# Patient Record
Sex: Female | Born: 1935 | Race: White | Hispanic: No | Smoking: Never smoker
Health system: Southern US, Community
[De-identification: ages and names within clinical notes are randomized; demographics above are authoritative.]

## PROBLEM LIST (undated history)

## (undated) DIAGNOSIS — I639 Cerebral infarction, unspecified: Secondary | ICD-10-CM

## (undated) DIAGNOSIS — K219 Gastro-esophageal reflux disease without esophagitis: Secondary | ICD-10-CM

## (undated) DIAGNOSIS — IMO0002 Reserved for concepts with insufficient information to code with codable children: Secondary | ICD-10-CM

## (undated) DIAGNOSIS — I519 Heart disease, unspecified: Secondary | ICD-10-CM

## (undated) DIAGNOSIS — I10 Essential (primary) hypertension: Secondary | ICD-10-CM

## (undated) HISTORY — PX: CARDIAC SURGERY: SHX584

## (undated) HISTORY — DX: Heart disease, unspecified: I51.9

## (undated) HISTORY — PX: ABDOMINAL HYSTERECTOMY: SHX81

## (undated) HISTORY — PX: TUBAL LIGATION: SHX77

## (undated) HISTORY — DX: Gastro-esophageal reflux disease without esophagitis: K21.9

## (undated) HISTORY — DX: Cerebral infarction, unspecified: I63.9

## (undated) HISTORY — PX: EYE SURGERY: SHX253

## (undated) HISTORY — DX: Essential (primary) hypertension: I10

## (undated) HISTORY — DX: Reserved for concepts with insufficient information to code with codable children: IMO0002

## (undated) HISTORY — PX: APPENDECTOMY: SHX54

---

## 2008-09-25 ENCOUNTER — Ambulatory Visit: Payer: Self-pay | Admitting: Unknown Physician Specialty

## 2008-09-26 ENCOUNTER — Ambulatory Visit: Payer: Self-pay | Admitting: Unknown Physician Specialty

## 2008-10-10 ENCOUNTER — Ambulatory Visit: Payer: Self-pay | Admitting: Gynecologic Oncology

## 2008-10-15 ENCOUNTER — Ambulatory Visit: Payer: Self-pay | Admitting: Gynecologic Oncology

## 2008-11-05 ENCOUNTER — Ambulatory Visit: Payer: Self-pay | Admitting: Gynecologic Oncology

## 2008-11-10 ENCOUNTER — Ambulatory Visit: Payer: Self-pay | Admitting: Gynecologic Oncology

## 2008-11-12 ENCOUNTER — Inpatient Hospital Stay: Payer: Self-pay | Admitting: Unknown Physician Specialty

## 2008-11-25 ENCOUNTER — Ambulatory Visit: Payer: Self-pay | Admitting: Gynecologic Oncology

## 2008-12-11 ENCOUNTER — Ambulatory Visit: Payer: Self-pay | Admitting: Gynecologic Oncology

## 2009-07-29 ENCOUNTER — Inpatient Hospital Stay: Payer: Self-pay | Admitting: Cardiology

## 2009-08-21 ENCOUNTER — Ambulatory Visit: Payer: Self-pay | Admitting: Internal Medicine

## 2010-12-25 ENCOUNTER — Ambulatory Visit: Payer: Self-pay | Admitting: Internal Medicine

## 2014-12-17 ENCOUNTER — Other Ambulatory Visit
Admission: RE | Admit: 2014-12-17 | Discharge: 2014-12-17 | Disposition: A | Discharge status: Home / Self Care | Payer: Medicare Other | Source: Other Acute Inpatient Hospital | Attending: Cardiology | Admitting: Cardiology

## 2014-12-17 DIAGNOSIS — R079 Chest pain, unspecified: Secondary | ICD-10-CM | POA: Diagnosis present

## 2014-12-17 LAB — TROPONIN I: Troponin I: <0.03 ng/mL (ref <0.031)

## 2017-09-16 ENCOUNTER — Ambulatory Visit (INDEPENDENT_AMBULATORY_CARE_PROVIDER_SITE_OTHER): Payer: Medicare Other | Admitting: Obstetrics and Gynecology

## 2017-09-16 ENCOUNTER — Encounter: Payer: Self-pay | Admitting: Obstetrics and Gynecology

## 2017-09-16 VITALS — BP 148/78 | HR 82 | Ht 65.0 in | Wt 196.0 lb

## 2017-09-16 DIAGNOSIS — R300 Dysuria: Secondary | ICD-10-CM

## 2017-09-16 DIAGNOSIS — L900 Lichen sclerosus et atrophicus: Secondary | ICD-10-CM

## 2017-09-16 DIAGNOSIS — R350 Frequency of micturition: Secondary | ICD-10-CM

## 2017-09-16 LAB — POCT URINALYSIS DIPSTICK
Bilirubin, UA: NEGATIVE
Blood, UA: NEGATIVE
GLUCOSE UA: NEGATIVE
KETONES UA: NEGATIVE
Leukocytes, UA: NEGATIVE
NITRITE UA: NEGATIVE
PROTEIN UA: NEGATIVE
SPEC GRAV UA: 1.015 (ref 1.010–1.025)
Urobilinogen, UA: 0.2 E.U./dL
pH, UA: 6 (ref 5.0–8.0)

## 2017-09-16 MED ORDER — CLOBETASOL PROPIONATE 0.05 % EX OINT: TOPICAL_OINTMENT | CUTANEOUS | 5 refills | Status: AC

## 2017-09-16 NOTE — Progress Notes (Signed)
Patient ID: Jaime Stevenson, female   DOB: May 10, 1935, 82 y.o.   MRN: [id number]  Reason for Consult: Post menopausal bleeding (single episode PMB)   Referred by No ref. provider found  Subjective:     HPI:  Jaime MornWilma P Wease is a 82 y.o. female On Tuesday Jaime Stevenson had bright red vaginal bleeding when she wiped. She has not noticed any blood in her bowel movements or urine.  Medical records were reviewed and patien had a TAH, BSO, pelvic lymph node dissection for endometrial cancer in 2010, Stage IA, grade I. No myometrial invasion.   Past Medical History:  Diagnosis Date  . Heart disease   . Hypertension    Family History  Problem Relation Age of Onset  . Ovarian cancer Mother [id number]   Past Surgical History:  Procedure Laterality Date  . ABDOMINAL HYSTERECTOMY    . CARDIAC SURGERY     stent placement    Short Social History:  Social History   Tobacco Use  . Smoking status: Never Smoker  . Smokeless tobacco: Never Used  Substance Use Topics  . Alcohol use: Never    Frequency: Never    Allergies  Allergen Reactions  . Morphine     Other reaction(s): Hallucination    Current Outpatient Medications  Medication Sig Dispense Refill  . chlorthalidone (HYGROTON) 25 MG tablet   3  . famotidine (PEPCID) 20 MG tablet Take 20 mg by mouth 2 (two) times daily.    . metoprolol tartrate (LOPRESSOR) 25 MG tablet     . warfarin (COUMADIN) 2 MG tablet      No current facility-administered medications for this visit.     Review of Systems  Constitutional: Negative for chills, fatigue, fever and unexpected weight change.  HENT: Negative for trouble swallowing.  Eyes: Negative for loss of vision.  Respiratory: Negative for cough, shortness of breath and wheezing.  Cardiovascular: Negative for chest pain, leg swelling, palpitations and syncope.  GI: Negative for abdominal pain, blood in stool, diarrhea, nausea and vomiting.  GU: Negative for difficulty urinating, dysuria, frequency and  hematuria.  Musculoskeletal: Negative for back pain, leg pain and joint pain.  Skin: Negative for rash.  Neurological: Negative for dizziness, headaches, light-headedness, numbness and seizures.  Psychiatric: Negative for behavioral problem, confusion, depressed mood and sleep disturbance.        Objective:  Objective   Vitals:   09/16/17 1422  BP: (!) 148/78  Pulse: 82  Weight: 196 lb (88.9 kg)  Height: 5\' 5"  (1.651 m)   Body mass index is 32.62 kg/m.  Physical Exam Vitals and nursing note reviewed. Exam conducted with a chaperone present.  Constitutional:      Appearance: Normal appearance. She is well-developed.  HENT:     Head: Normocephalic and atraumatic.  Eyes:     Extraocular Movements: Extraocular movements intact.     Pupils: Pupils are equal, round, and reactive to light.  Cardiovascular:     Rate and Rhythm: Normal rate and regular rhythm.  Pulmonary:     Effort: Pulmonary effort is normal. No respiratory distress.     Breath sounds: Normal breath sounds.  Abdominal:     General: Abdomen is flat.     Palpations: Abdomen is soft.  Genitourinary:    Comments: External: Appearance consistent with lichen sclerosis. Speculum examination: Normal appearing cuff. No blood in the vaginal vault. Bimanual exam: No adnexal masses. No adnexal tenderness. Pelvis not fixed.  Musculoskeletal:  General: No signs of injury.  Skin:    General: Skin is warm and dry.  Neurological:     Mental Status: She is alert and oriented to person, place, and time.  Psychiatric:        Behavior: Behavior normal.        Thought Content: Thought content normal.        Judgment: Judgment normal.        Assessment/Plan:    82 yo  With vaginal bleeding, hx of endometial cancer.  CT ordered.  Clobetasol for lichen Sclerosis  Adelene Idler MD [facility], [facility] 09/16/17 2:57 PM

## 2017-10-07 ENCOUNTER — Encounter: Payer: Self-pay | Admitting: Obstetrics and Gynecology

## 2017-10-07 ENCOUNTER — Ambulatory Visit (INDEPENDENT_AMBULATORY_CARE_PROVIDER_SITE_OTHER): Payer: Medicare Other | Admitting: Obstetrics and Gynecology

## 2017-10-07 VITALS — BP 140/86 | HR 79 | Ht 65.0 in | Wt 201.0 lb

## 2017-10-07 DIAGNOSIS — Z08 Encounter for follow-up examination after completed treatment for malignant neoplasm: Secondary | ICD-10-CM

## 2017-10-07 DIAGNOSIS — Z8542 Personal history of malignant neoplasm of other parts of uterus: Secondary | ICD-10-CM

## 2017-10-07 NOTE — Progress Notes (Signed)
Patient ID: Jaime Stevenson, female   DOB: [birth year], 82 y.o.   MRN: [id number]  Reason for Consult: Follow-up (Vaginal itching. Frequent urination )   Referred by [facility]  Subjective:     HPI:  Jaime MornWilma P Boucher is a 82 y.o. female  She did not get ointment because it was too expensive out of pocket. Her lichen simplex has not been bothering her with itching. She has not had any more bleeding.   I discussed with Marylouise StacksWilma today how the pathology from her hysterectomy had showed that she had stage I endometrial cancer.  She had told me but previously that she did not recall having endometrial cancer but reports today that when she went home and thought about it she did remember being told that she had cancer.  She never followed up for her endometrial cancer.  I recommended that since she had had vaginal bleeding she should have a CT to look for any signs of recurrence.  She told me she does not want to have CT. I asked her why she did not want it to have the CT and she said that she did not want to know if she has endometrial cancer because if she does have a cancer recurrance she does not want to do anything about it. Her husband has had cancer treatment for 19 years and she does not think she wants to deal with what he has been through. He very often has to have CTs done and she is nervous about being in a confined space.  She says that she will consider the CT and let me know if she does want to have it.   Past Medical History:  Diagnosis Date  . Heart disease   . Hypertension    Family History  Problem Relation Age of Onset  . Ovarian cancer Mother 281   Past Surgical History:  Procedure Laterality Date  . ABDOMINAL HYSTERECTOMY    . CARDIAC SURGERY     stent placement    Short Social History:  Social History   Tobacco Use  . Smoking status: Never Smoker  . Smokeless tobacco: Never Used  Substance Use Topics  . Alcohol use: Never    Frequency: Never    Allergies   Allergen Reactions  . Morphine     Other reaction(s): Hallucination    Current Outpatient Medications  Medication Sig Dispense Refill  . chlorthalidone (HYGROTON) 25 MG tablet   3  . famotidine (PEPCID) 20 MG tablet Take 20 mg by mouth 2 (two) times daily.    . metoprolol tartrate (LOPRESSOR) 25 MG tablet     . warfarin (COUMADIN) 2 MG tablet     . clobetasol ointment (TEMOVATE) 0.05 % Apply to affected area every night for 4 weeks, then every other day for 4 weeks and then twice a week for 4 weeks or until resolution. (Patient not taking: Reported on 10/07/2017) 30 g 5   No current facility-administered medications for this visit.     Review of Systems  Constitutional: Negative for chills, fatigue, fever and unexpected weight change.  HENT: Negative for trouble swallowing.  Eyes: Negative for loss of vision.  Respiratory: Negative for cough, shortness of breath and wheezing.  Cardiovascular: Negative for chest pain, leg swelling, palpitations and syncope.  GI: Negative for abdominal pain, blood in stool, diarrhea, nausea and vomiting.  GU: Negative for difficulty urinating, dysuria, frequency and hematuria.  Musculoskeletal: Negative for back pain, leg pain and joint pain.  Skin: Negative for rash.  Neurological: Negative for dizziness, headaches, light-headedness, numbness and seizures.  Psychiatric: Negative for behavioral problem, confusion, depressed mood and sleep disturbance.        Objective:  Objective   Vitals:   10/07/17 0915  BP: 140/86  Pulse: 79  Weight: 201 lb (91.2 kg)  Height: 5\' 5"  (1.651 m)   Body mass index is 33.45 kg/m.  Physical Exam  Constitutional: She is oriented to person, place, and time. She appears well-developed and well-nourished.  HENT:  Head: Normocephalic and atraumatic.  Eyes: EOM are normal.  Cardiovascular: Normal rate, regular rhythm and normal heart sounds.  Pulmonary/Chest: Effort normal and breath sounds normal.   Genitourinary:  Genitourinary Comments: Lichen sclerosis present with complete loss of pigment in   Neurological: She is alert and oriented to person, place, and time.  Skin: Skin is warm and dry.  Psychiatric: She has a normal mood and affect. Her behavior is normal. Judgment and thought content normal.  Nursing note and vitals reviewed.      Assessment/Plan:    82 yo She did not get ointment because it was too expensive out of pocket. Her lichen simplex has not been bothering her with itching. She has not had any more bleeding.  She says today that she does not want to know if she has endometrial cancer because id she does have it she does not want to do anything about it. Her husband has had cancer treatment for 19 years and she does not think she wants to deal with what he has been through.  She will follow up as desired  Adelene Idler MD, [facility], [facility] 08/02/2020 12:58 PM

## 2017-10-10 ENCOUNTER — Telehealth: Payer: Self-pay | Admitting: Obstetrics and Gynecology

## 2017-10-10 NOTE — Telephone Encounter (Signed)
Patient returned the call and is aware of appointment and location. Address and phone# given.

## 2017-10-10 NOTE — Telephone Encounter (Signed)
Patient is scheduled for Friday, 10/14/17 @ 8:00am at [facility]. Patient should arrive by 7:45am. Patient must be NPO 4 hrs prior to test. Patient must pick up a prep kit no later than Thursday. Lmtrc.

## 2017-10-11 ENCOUNTER — Other Ambulatory Visit: Payer: Self-pay | Admitting: Obstetrics and Gynecology

## 2017-10-11 DIAGNOSIS — Z8542 Personal history of malignant neoplasm of other parts of uterus: Secondary | ICD-10-CM

## 2017-10-11 NOTE — Telephone Encounter (Signed)
Order is placed. Thank you.

## 2017-10-14 ENCOUNTER — Ambulatory Visit
Admission: RE | Admit: 2017-10-14 | Discharge: 2017-10-14 | Disposition: A | Discharge status: Home / Self Care | Payer: Medicare Other | Source: Ambulatory Visit | Attending: Obstetrics and Gynecology | Admitting: Obstetrics and Gynecology

## 2017-10-14 DIAGNOSIS — Z08 Encounter for follow-up examination after completed treatment for malignant neoplasm: Secondary | ICD-10-CM | POA: Diagnosis present

## 2017-10-14 DIAGNOSIS — Z8542 Personal history of malignant neoplasm of other parts of uterus: Secondary | ICD-10-CM | POA: Diagnosis present

## 2017-10-14 DIAGNOSIS — K8020 Calculus of gallbladder without cholecystitis without obstruction: Secondary | ICD-10-CM | POA: Diagnosis not present

## 2017-10-14 DIAGNOSIS — K760 Fatty (change of) liver, not elsewhere classified: Secondary | ICD-10-CM | POA: Insufficient documentation

## 2017-10-14 DIAGNOSIS — K5730 Diverticulosis of large intestine without perforation or abscess without bleeding: Secondary | ICD-10-CM | POA: Insufficient documentation

## 2017-10-14 MED ORDER — IOPAMIDOL (ISOVUE-300) INJECTION 61%
80.0000 mL | Freq: Once | INTRAVENOUS | Status: AC | PRN
  Administered 2017-10-14: 80 mL via INTRAVENOUS

## 2017-10-14 NOTE — Progress Notes (Signed)
Called and discussed result with patient, she will need a follow up CT in 6 months for lymphocele.

## 2020-07-01 ENCOUNTER — Other Ambulatory Visit: Payer: Self-pay | Admitting: Internal Medicine

## 2020-07-01 DIAGNOSIS — R1013 Epigastric pain: Secondary | ICD-10-CM

## 2020-07-11 ENCOUNTER — Ambulatory Visit
Admission: RE | Admit: 2020-07-11 | Discharge: 2020-07-11 | Disposition: A | Discharge status: Home / Self Care | Payer: Medicare Other | Source: Ambulatory Visit | Attending: Internal Medicine | Admitting: Internal Medicine

## 2020-07-11 ENCOUNTER — Other Ambulatory Visit: Payer: Self-pay

## 2020-07-11 DIAGNOSIS — R1013 Epigastric pain: Secondary | ICD-10-CM | POA: Diagnosis not present

## 2020-07-11 MED ORDER — IOHEXOL 300 MG/ML  SOLN
100.0000 mL | Freq: Once | INTRAMUSCULAR | Status: AC | PRN
  Administered 2020-07-11: 100 mL via INTRAVENOUS

## 2021-05-04 ENCOUNTER — Ambulatory Visit
Admission: RE | Admit: 2021-05-04 | Discharge: 2021-05-04 | Disposition: A | Discharge status: Home / Self Care | Payer: Medicare Other | Source: Ambulatory Visit | Attending: Physician Assistant | Admitting: Physician Assistant

## 2021-05-04 ENCOUNTER — Other Ambulatory Visit: Payer: Self-pay | Admitting: Physician Assistant

## 2021-05-04 ENCOUNTER — Other Ambulatory Visit: Payer: Self-pay

## 2021-05-04 DIAGNOSIS — M25475 Effusion, left foot: Secondary | ICD-10-CM | POA: Diagnosis present

## 2022-08-28 ENCOUNTER — Other Ambulatory Visit: Payer: Self-pay

## 2022-08-28 ENCOUNTER — Emergency Department
Admission: EM | Admit: 2022-08-28 | Discharge: 2022-08-28 | Disposition: A | Discharge status: Home / Self Care | Payer: Medicare Other | Attending: Emergency Medicine | Admitting: Emergency Medicine

## 2022-08-28 DIAGNOSIS — M545 Low back pain, unspecified: Secondary | ICD-10-CM | POA: Insufficient documentation

## 2022-08-28 DIAGNOSIS — B029 Zoster without complications: Secondary | ICD-10-CM | POA: Diagnosis not present

## 2022-08-28 DIAGNOSIS — R21 Rash and other nonspecific skin eruption: Secondary | ICD-10-CM | POA: Diagnosis present

## 2022-08-28 MED ORDER — OXYCODONE-ACETAMINOPHEN 5-325 MG PO TABS
1.0000 | ORAL_TABLET | Freq: Four times a day (QID) | ORAL | 0 refills | Status: AC | PRN
Start: 2022-08-28 — End: ?

## 2022-08-28 MED ORDER — OXYCODONE-ACETAMINOPHEN 5-325 MG PO TABS
1.0000 | ORAL_TABLET | Freq: Once | ORAL | Status: AC
  Administered 2022-08-28: 1 via ORAL
  Filled 2022-08-28: qty 1

## 2022-08-28 MED ORDER — VALACYCLOVIR HCL 1 G PO TABS: 1000.0000 mg | ORAL_TABLET | Freq: Three times a day (TID) | ORAL | 0 refills | Status: AC

## 2022-08-28 MED ORDER — PREDNISONE 10 MG PO TABS: 10.0000 mg | ORAL_TABLET | ORAL | 0 refills | Status: DC

## 2022-08-28 NOTE — ED Notes (Signed)
Pt verbalizes understanding of discharge instructions. Opportunity for questioning and answers were provided. Pt discharged from ED to home with daughter.    

## 2022-08-28 NOTE — ED Provider Notes (Signed)
[facility] Provider Note  Patient Contact: 5:41 PM (approximate)   History   Rash (X3 days)   HPI  Jaime Stevenson is a 87 y.o. female who presents the emergency department complaining of back pain with rash.  Patient states that 3 days ago she started having a burning pain in her back.  She went to urgent care yesterday and had no other symptoms, diagnosed with musculoskeletal back pain and started on steroid and muscle relaxer.  Today she started to develop a linear rash and distribution of her pain along the left flank region.  Patient is concerned she may have shingles.  No other complaints to include urinary changes, GI complaints.     Physical Exam   Triage Vital Signs: ED Triage Vitals  Enc Vitals Group     BP 08/28/22 1635 (!) 203/65     Pulse Rate 08/28/22 1635 (!) 55     Resp 08/28/22 1635 16     Temp 08/28/22 1635 98 F (36.7 C)     Temp src --      SpO2 08/28/22 1635 98 %     Weight 08/28/22 1632 202 lb 13.2 oz (92 kg)     Height 08/28/22 1632 5\' 5"  (1.651 m)     Head Circumference --      Peak Flow --      Pain Score 08/28/22 1632 10     Pain Loc --      Pain Edu? --      Excl. in GC? --     Most recent vital signs: Vitals:   08/28/22 1635  BP: (!) 203/65  Pulse: (!) 55  Resp: 16  Temp: 98 F (36.7 C)  SpO2: 98%     General: Alert and in no acute distress.  Cardiovascular:  Good peripheral perfusion Respiratory: Normal respiratory effort without tachypnea or retractions. Lungs CTAB. Good air entry to the bases with no decreased or absent breath sounds. Musculoskeletal: Full range of motion to all extremities.  Neurologic:  No gross focal neurologic deficits are appreciated.  Skin:   Linear rash in dermatomal distribution left flank.  No vesicle formation currently, no cellulitic changes but does have a linear erythematous rash developing to the left flank Other:   ED Results / Procedures / Treatments   Labs (all labs  ordered are listed, but only abnormal results are displayed) Labs Reviewed - No data to display   EKG     RADIOLOGY   No results found.  PROCEDURES:  Critical Care performed: No  Procedures   MEDICATIONS ORDERED IN ED: Medications  oxyCODONE-acetaminophen (PERCOCET/ROXICET) 5-325 MG per tablet 1 tablet (has no administration in time range)     IMPRESSION / MDM / ASSESSMENT AND PLAN / ED COURSE  I reviewed the triage vital signs and the nursing notes.                                 Differential diagnosis includes, but is not limited to, shingles, cellulitis, nephrolithiasis, pyelonephritis, musculoskeletal back pain  Patient's presentation is most consistent with acute presentation with potential threat to life or bodily function.   Patient's diagnosis is consistent with shingles.  Patient presents emergency department with pain in the left flank region with a newly developing rash.  Patient has described a burning pain to the left flank for the last 3 days.  Rash developed today.  Findings  are in dermatomal distribution are consistent with shingles.  Patient will be placed on antiviral, steroid taper and pain medication.  Follow-up with her primary care.  Concerning signs and symptoms discussed with the patient.  I discussed with patient that any open wounds or draining lesions should be considered contagious and should be covered to prevent spread.  Patient is agreeable with treatment plan.  Will follow-up with primary care as needed..  Patient is given ED precautions to return to the ED for any worsening or new symptoms.     FINAL CLINICAL IMPRESSION(S) / ED DIAGNOSES   Final diagnoses:  Herpes zoster without complication     Rx / DC Orders   ED Discharge Orders          Ordered    predniSONE (DELTASONE) 10 MG tablet  As directed       Note to Pharmacy: Take on a pattern of 6, 6, 5, 5, 4, 4, 3, 3, 2, 2, 1, 1   08/28/22 1822    valACYclovir (VALTREX) 1000  MG tablet  3 times daily        08/28/22 1822    oxyCODONE-acetaminophen (PERCOCET/ROXICET) 5-325 MG tablet  Every 6 hours PRN        08/28/22 1822             Note:  This document was prepared using Dragon voice recognition software and may include unintentional dictation errors.   Lanette Hampshire 08/28/22 1825    Concha Se, MD 08/29/22 1122

## 2022-08-28 NOTE — ED Triage Notes (Signed)
Pt to ED from home for possible shingles. She was seen at walk-in clinic yesterday. They worked her up for UTI. Pt has mild rash on posterior left back area. Raised, red and painful. Pt is CAOx4 and in no acute distress and ambulatory in triage.

## 2022-08-28 NOTE — ED Notes (Signed)
Provider aware of pt's BP and confirmed to continue with discharge.

## 2022-09-13 ENCOUNTER — Ambulatory Visit
Admission: EM | Admit: 2022-09-13 | Discharge: 2022-09-13 | Disposition: A | Discharge status: Home / Self Care | Payer: Medicare Other | Attending: Emergency Medicine | Admitting: Emergency Medicine

## 2022-09-13 ENCOUNTER — Telehealth: Payer: Self-pay | Admitting: Emergency Medicine

## 2022-09-13 DIAGNOSIS — K5900 Constipation, unspecified: Secondary | ICD-10-CM

## 2022-09-13 DIAGNOSIS — M79622 Pain in left upper arm: Secondary | ICD-10-CM

## 2022-09-13 MED ORDER — DOCUSATE SODIUM 50 MG PO CAPS: 50.0000 mg | ORAL_CAPSULE | Freq: Every day | ORAL | 1 refills | Status: DC

## 2022-09-13 MED ORDER — PREDNISONE 10 MG (21) PO TBPK: ORAL_TABLET | Freq: Every day | ORAL | 0 refills | Status: DC

## 2022-09-13 MED ORDER — LACTULOSE 10 GM/15ML PO SOLN: 10.0000 g | Freq: Every day | ORAL | 0 refills | Status: AC | PRN

## 2022-09-13 MED ORDER — LACTULOSE 10 GM/15ML PO SOLN: 10.0000 g | Freq: Every day | ORAL | 0 refills | Status: DC | PRN

## 2022-09-13 MED ORDER — DOCUSATE SODIUM 50 MG PO CAPS: 50.0000 mg | ORAL_CAPSULE | Freq: Every day | ORAL | 1 refills | Status: AC

## 2022-09-13 MED ORDER — PREDNISONE 10 MG (21) PO TBPK: ORAL_TABLET | Freq: Every day | ORAL | 0 refills | Status: AC

## 2022-09-13 NOTE — Discharge Instructions (Signed)
Your underarm -This time related pain to be muscular, is not at the area of shingles rash and started priors therefore do not think this is related, there are no current masses or lumps within the underarm or breast tissue -Take prednisone every morning with food as directed to treat for muscular pain, may take Tylenol in addition, unable to use anti-inflammatory medicines and ibuprofen family due to your use of warfarin -May use heat over the area in 10 to 15-minute intervals -May massage and stretch area as tolerated -If pain continues past use of medicine please follow-up with primary doctor for reevaluation   For your hardened stools -Take dose of lactulose every morning for up to 3 days, after having a complete bowel movement use of medication -Begin use of a daily stool softener, this will not make you go to the bathroom but should make it easier -May continue to use MiraLAX as needed if a complete bowel movement has not occurred within a 3-day timeframe -Ensure that you are including water throughout the day as well as fiber in your diet to help with bowel movements -If you continue to have constipation please reach out to your primary doctor for further evaluation and additional medications

## 2022-09-13 NOTE — Telephone Encounter (Signed)
[facility] to [facility]

## 2022-09-13 NOTE — ED Triage Notes (Signed)
Patient presents to UC for shingles rash on left back area since 05/18 and LUQ pain since 5 days. Daughter states she has a hx of chronic constipation, last BM 2 days ago. She reports being evaluated by her PCP and imaging was completed. Per daughter no recommendations were given to patient by provider but patient has been taking mira lax with no improvement. She was also prescribed gabapentin for shingles pain.

## 2022-09-13 NOTE — Telephone Encounter (Signed)
Would like medications sent to the  [facility] in [place], prescription sent

## 2022-09-13 NOTE — ED Provider Notes (Signed)
Jaime Stevenson    CSN: [id number] Arrival date & time: 09/13/22  1323      History   Chief Complaint Chief Complaint  Patient presents with   Abdominal Pain   Herpes Zoster    HPI Jaime Stevenson is a 87 y.o. female.   Patient presents for evaluation of pain to the left axilla radiating to the left breast present for least 2 months.  Pain is constant and can be felt with all movement but is not exacerbated by anything in particular.  Denies numbness, tingling, injury or trauma.  Has taken several medications over the last few months for pain including narcotics, gabapentin and Tylenol which have all left pain unchanged.  Patient concerned with constipation which she endorses is a chronic issue.  Last known bowel movement 2 days ago, described as 2-3 heart pebbles but does not feel as if she empties completely.  Associated abdominal bloating and intermittent left upper quadrant pain.  Decreased appetite but has been able to tolerate both food and liquids.  Has attempted use of MiraLAX with no relief.  Denies hematochezia, mucus within the stool, nausea, vomiting.  Past Medical History:  Diagnosis Date   Heart disease    Hypertension     There are no problems to display for this patient.   Past Surgical History:  Procedure Laterality Date   ABDOMINAL HYSTERECTOMY     CARDIAC SURGERY     stent placement    OB History     Gravida  3   Para  3   Term  3   Preterm      AB      Living  3      SAB      IAB      Ectopic      Multiple      Live Births  3            Home Medications    Prior to Admission medications   Medication Sig Start Date End Date Taking? Authorizing Provider  lactulose (CHRONULAC) 10 GM/15ML solution Take 15 mLs (10 g total) by mouth daily as needed for mild constipation. 09/13/22  Yes Enola Siebers R, NP  predniSONE (STERAPRED UNI-PAK 21 TAB) 10 MG (21) TBPK tablet Take by mouth daily. Take 6 tabs by mouth daily  for 1 days,  then 5 tabs for 1 days, then 4 tabs for 1 days, then 3 tabs for 1 days, 2 tabs for 1 days, then 1 tab by mouth daily for 1 days 09/13/22  Yes Salli Quarry R, NP  chlorthalidone (HYGROTON) 25 MG tablet  08/10/17   [provider]  clobetasol ointment (TEMOVATE) 0.05 % Apply to affected area every night for 4 weeks, then every other day for 4 weeks and then twice a week for 4 weeks or until resolution. Patient not taking: Reported on 10/07/2017 09/16/17   Natale Milch, MD  famotidine (PEPCID) 20 MG tablet Take 20 mg by mouth 2 (two) times daily.    [provider]  metoprolol tartrate (LOPRESSOR) 25 MG tablet  08/25/17   [provider]  oxyCODONE-acetaminophen (PERCOCET/ROXICET) 5-325 MG tablet Take 1 tablet by mouth every 6 (six) hours as needed for severe pain. 08/28/22   Cuthriell, Delorise Royals, PA-C  warfarin (COUMADIN) 2 MG tablet  09/06/17   [provider]    Family History Family History  Problem Relation Age of Onset   Ovarian cancer Mother 11  Social History Social History   Tobacco Use   Smoking status: Never   Smokeless tobacco: Never  Vaping Use   Vaping Use: Never used  Substance Use Topics   Alcohol use: Never   Drug use: Never     Allergies   Morphine   Review of Systems Review of Systems Defer to HPI   Physical Exam Triage Vital Signs ED Triage Vitals  Enc Vitals Group     BP 09/13/22 1411 (!) 150/78     Pulse Rate 09/13/22 1411 72     Resp 09/13/22 1411 16     Temp 09/13/22 1411 97.8 F (36.6 C)     Temp Source 09/13/22 1411 Oral     SpO2 09/13/22 1411 96 %     Weight --      Height --      Head Circumference --      Peak Flow --      Pain Score 09/13/22 1410 4     Pain Loc --      Pain Edu? --      Excl. in GC? --    No data found.  Updated Vital Signs BP (!) 150/78 (BP Location: Left Arm)   Pulse 72   Temp 97.8 F (36.6 C) (Oral)   Resp 16   SpO2 96%   Visual Acuity Right Eye Distance:    Left Eye Distance:   Bilateral Distance:    Right Eye Near:   Left Eye Near:    Bilateral Near:     Physical Exam Constitutional:      Appearance: Normal appearance. She is well-developed.  Eyes:     Extraocular Movements: Extraocular movements intact.  Pulmonary:     Effort: Pulmonary effort is normal.  Chest:       Comments: Tenderness present underneath the left axilla at approximately 6 PM without mass, lesion, ecchymosis, swelling or deformity, radiating to the lateral aspect of the left breast approximately at 3 PM without mass, lesion, ecchymosis, swelling or deformity, chest wall is symmetrical, no lymphadenopathy present, able to complete all range of motion of the left arm Abdominal:     General: Abdomen is flat. Bowel sounds are normal.     Palpations: Abdomen is soft.  Neurological:     Mental Status: She is alert and oriented to person, place, and time.      UC Treatments / Results  Labs (all labs ordered are listed, but only abnormal results are displayed) Labs Reviewed - No data to display  EKG   Radiology No results found.  Procedures Procedures (including critical care time)  Medications Ordered in UC Medications - No data to display  Initial Impression / Assessment and Plan / UC Course  I have reviewed the triage vital signs and the nursing notes.  Pertinent labs & imaging results that were available during my care of the patient were reviewed by me and considered in my medical decision making (see chart for details).  Pain in the left axilla, constipation  No overt abnormality present on exam, discussed with patient, possibly muscular, prescribed prednisone taper recommended supportive care through heat with continued activity as tolerated, advise follow-up with primary doctor for persisting symptoms  Constipation is a chronic issue will move forward with conservative management in office, prescribed lactulose for short-term course, after  complete bowel movement patient is to use stool softener daily with MiraLAX as needed, for supportive measures recommend increase fluid intake through water and increase fiber within  the diet, advise follow-up with PCP if symptoms continue to persist Final Clinical Impressions(s) / UC Diagnoses   Final diagnoses:  Pain in left axilla  Constipation, unspecified constipation type     Discharge Instructions      Your underarm -This time related pain to be muscular, is not at the area of shingles rash and started priors therefore do not think this is related, there are no current masses or lumps within the underarm or breast tissue -Take prednisone every morning with food as directed to treat for muscular pain, may take Tylenol in addition, unable to use anti-inflammatory medicines and ibuprofen family due to your use of warfarin -May use heat over the area in 10 to 15-minute intervals -May massage and stretch area as tolerated -If pain continues past use of medicine please follow-up with primary doctor for reevaluation   For your hardened stools -Take dose of lactulose every morning for up to 3 days, after having a complete bowel movement use of medication -Begin use of a daily stool softener, this will not make you go to the bathroom but should make it easier -May continue to use MiraLAX as needed if a complete bowel movement has not occurred within a 3-day timeframe -Ensure that you are including water throughout the day as well as fiber in your diet to help with bowel movements -If you continue to have constipation please reach out to your primary doctor for further evaluation and additional medications   ED Prescriptions     Medication Sig Dispense Auth. Provider   predniSONE (STERAPRED UNI-PAK 21 TAB) 10 MG (21) TBPK tablet Take by mouth daily. Take 6 tabs by mouth daily  for 1 days, then 5 tabs for 1 days, then 4 tabs for 1 days, then 3 tabs for 1 days, 2 tabs for 1 days, then 1 tab  by mouth daily for 1 days 21 tablet Lilliona Blakeney R, NP   lactulose (CHRONULAC) 10 GM/15ML solution Take 15 mLs (10 g total) by mouth daily as needed for mild constipation. 236 mL Elektra Wartman, Elita Boone, NP      PDMP not reviewed this encounter.   Valinda Hoar, NP 09/13/22 [phone]

## 2023-01-02 IMAGING — CT CT ABD-PELV W/ CM
2 of 5 series · 16 of 46 positions shown, 18 images · IV contrast (omnipaque)
Comparison: None.

CLINICAL DATA: Left upper quadrant pain. Nausea and vomiting.
Diarrhea. History of hysterectomy and appendectomy.

EXAM:
CT ABDOMEN AND PELVIS WITH CONTRAST
TECHNIQUE: Multidetector CT imaging of the abdomen and pelvis was performed
using the standard protocol following bolus administration of
intravenous contrast.
CONTRAST:  100mL OMNIPAQUE IOHEXOL 300 MG/ML  SOLN

[Series 2: axial st · axial · 0.84mm/px · z∈[-894,-539]mm · 13 of 81 slices shown, 15 images]
[im 5/81  soft-tissue]
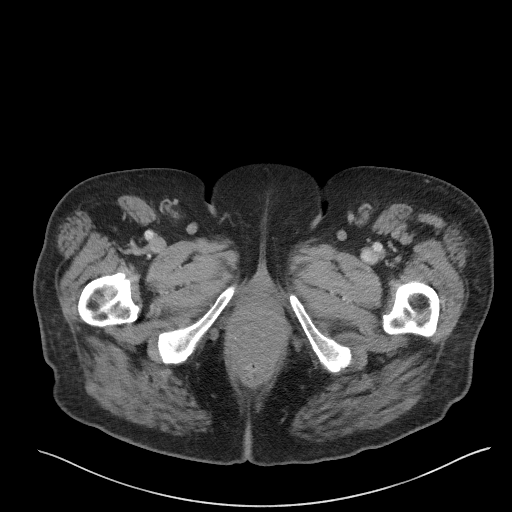
[im 5/81  bone]
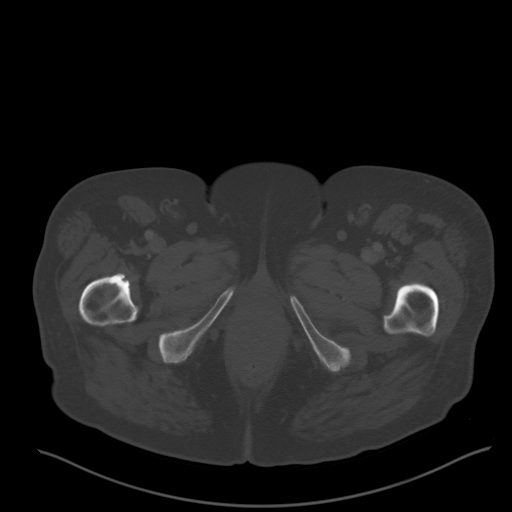
[im 13/81  soft-tissue]
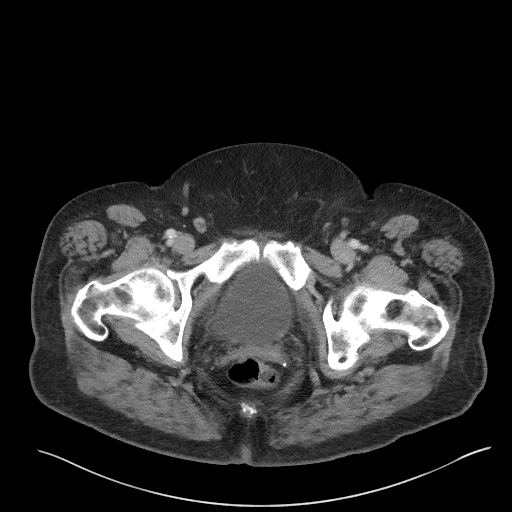
[im 17/81  soft-tissue]
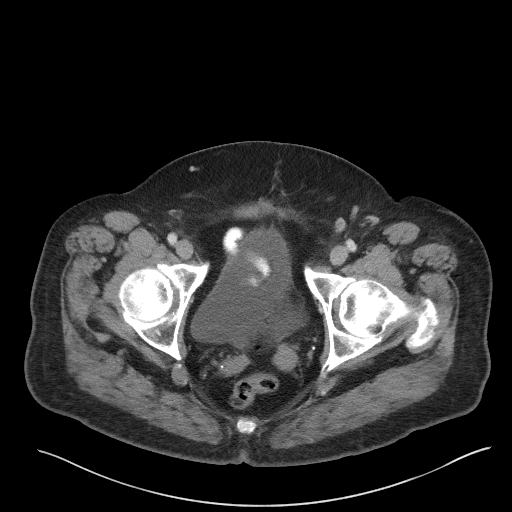
[im 22/81  soft-tissue]
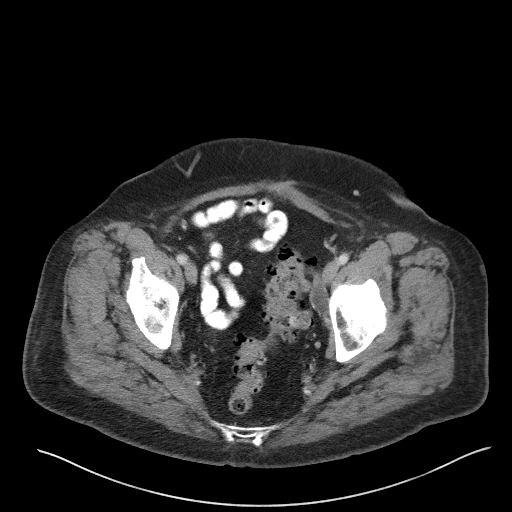
[im 30/81  soft-tissue]
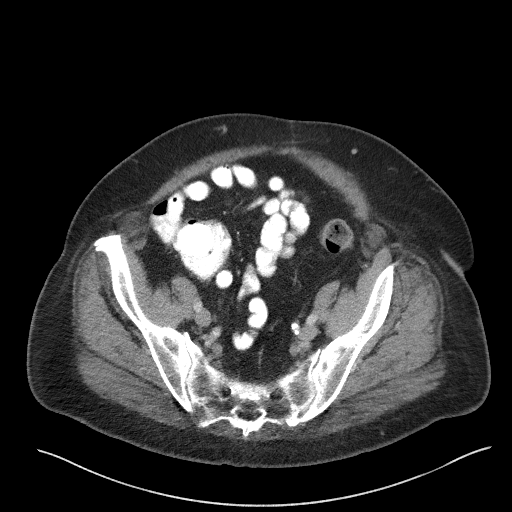
[im 34/81  soft-tissue]
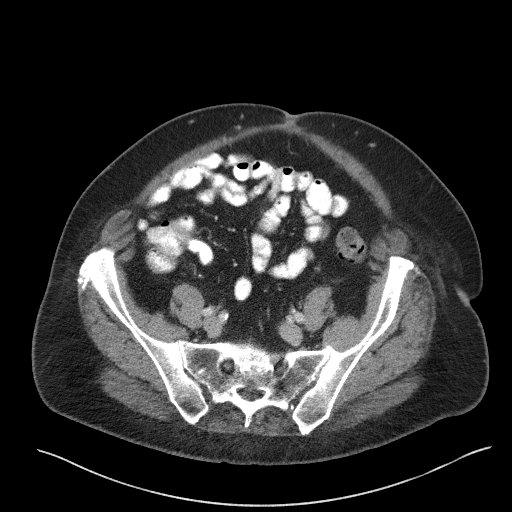
[im 43/81  soft-tissue]
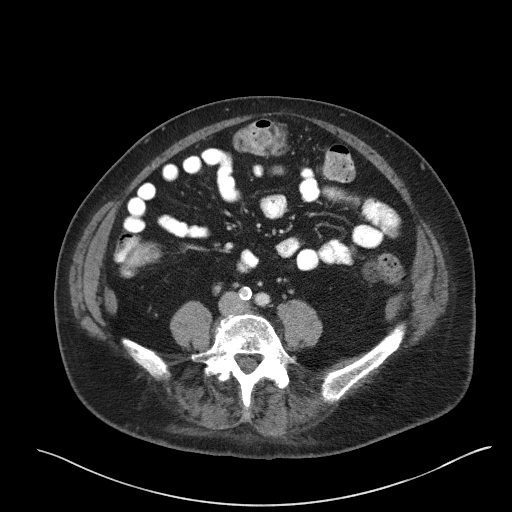
[im 47/81  soft-tissue]
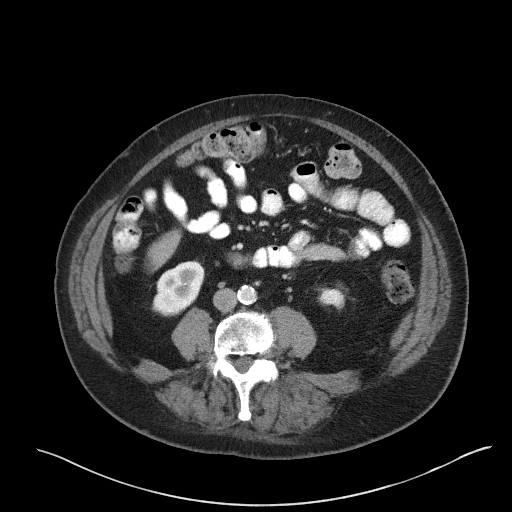
[im 51/81  soft-tissue]
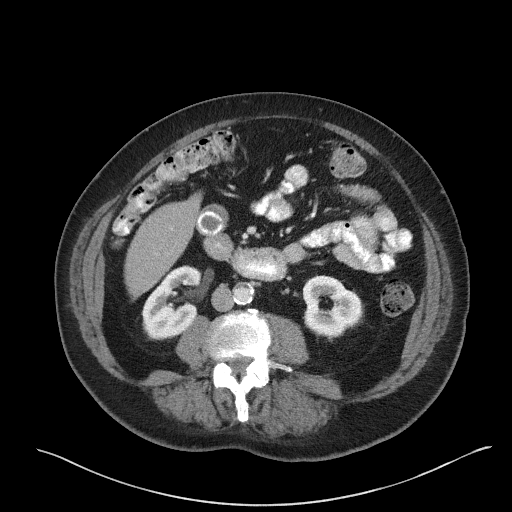
[im 51/81  bone]
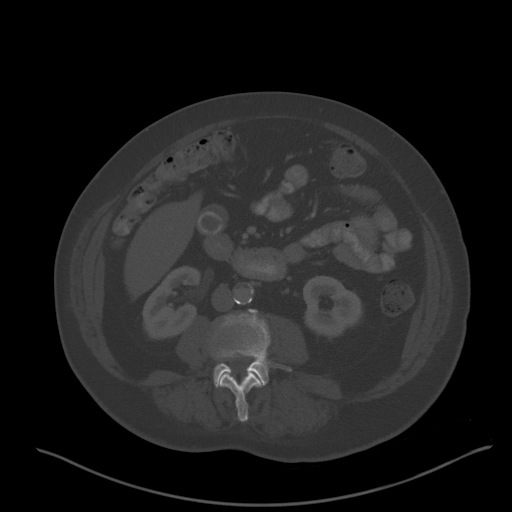
[im 59/81  soft-tissue]
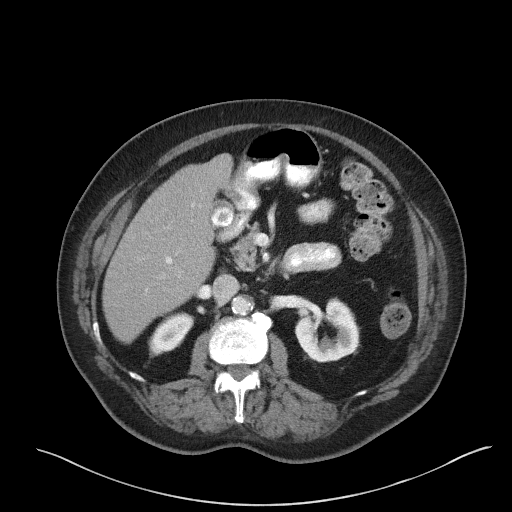
[im 64/81  soft-tissue]
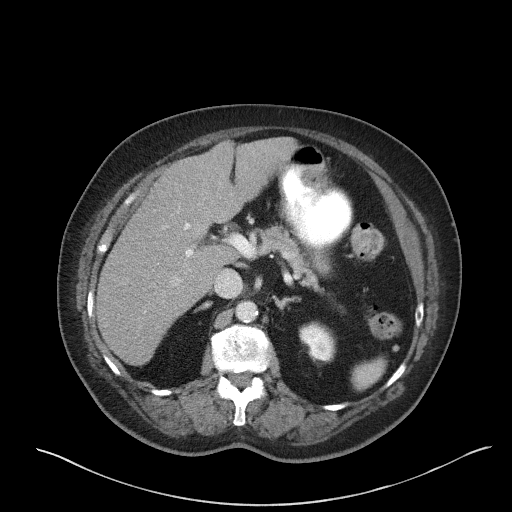
[im 68/81  soft-tissue]
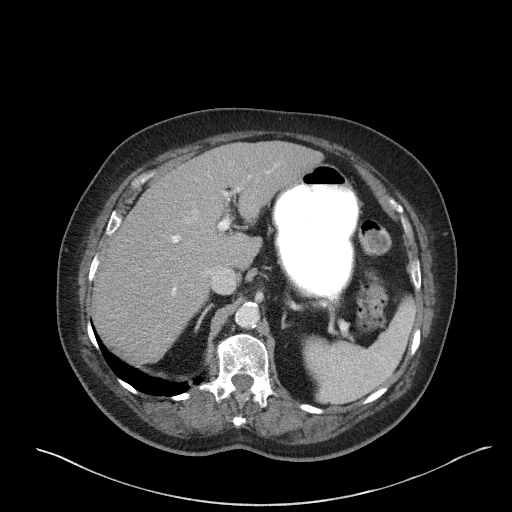
[im 76/81  soft-tissue]
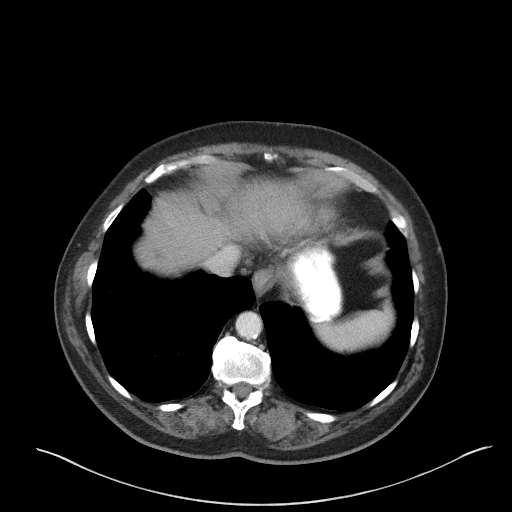

[Series 5: coronal st · coronal · 0.68mm/px · 3 of 104 slices shown]
[im 35/104  soft-tissue]
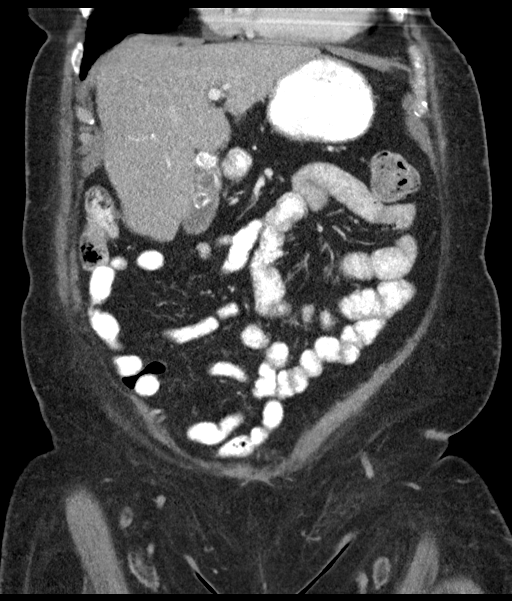
[im 46/104  soft-tissue]
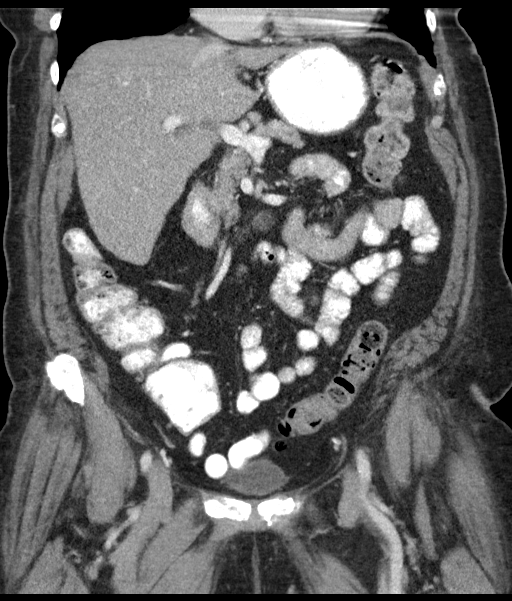
[im 58/104  soft-tissue]
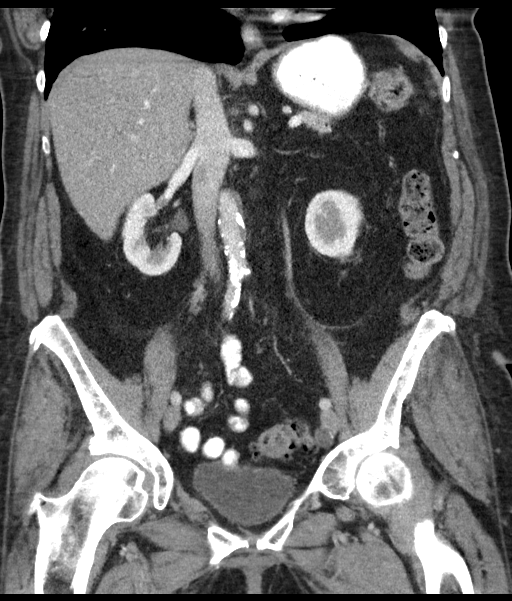

[16 of 46 positions shown; findings below may reference images not displayed]

FINDINGS: Lower chest: Right base linear atelectasis versus scarring.

Hepatobiliary: The hepatic parenchyma is diffusely hypodense
compared to the splenic parenchyma consistent with fatty
infiltration. No focal liver abnormality. Calcified gallstones
within the gallbladder lumen. Otherwise no gallbladder wall
thickening or pericholecystic fluid. No biliary dilatation.

Pancreas: No focal lesion. Normal pancreatic contour. No surrounding
inflammatory changes. No main pancreatic ductal dilatation.

Spleen: Normal in size without focal abnormality.

Adrenals/Urinary Tract:

No adrenal nodule bilaterally.

Bilateral kidneys enhance symmetrically. No hydronephrosis. No
hydroureter.

The urinary bladder is unremarkable.

On delayed imaging, there is no urothelial wall thickening and there
are no filling defects in the opacified portions of the bilateral
collecting systems or ureters.

Stomach/Bowel: PO contrast reaches the large bowel. Stomach is
within normal limits. Diffuse sigmoid diverticulosis. Post portion
of the duodenum diverticula ([DATE]). No evidence of bowel wall
thickening or dilatation. Status post appendectomy.

Vascular/Lymphatic: No abdominal aorta or iliac aneurysm. Severe
atherosclerotic plaque of the aorta and its branches. No abdominal,
pelvic, or inguinal lymphadenopathy.

Reproductive: Status post hysterectomy. No adnexal masses.

Other: No intraperitoneal free fluid. No intraperitoneal free gas.
No organized fluid collection.

Musculoskeletal:

No abdominal wall hernia or abnormality.

No suspicious lytic or blastic osseous lesions. No acute displaced
fracture. Multilevel mild degenerative changes of the spine.
IMPRESSION: 1. Diffuse sigmoid diverticulosis with no acute diverticulitis. Also
identified fourth portion of the duodenum diverticula with no
associated bowel perforation.
2. Cholelithiasis with no acute cholecystitis.
3. Hepatic steatosis.
4.  Aortic Atherosclerosis ([id number]).

## 2023-09-13 ENCOUNTER — Other Ambulatory Visit: Payer: Self-pay

## 2023-09-13 ENCOUNTER — Ambulatory Visit
Admission: RE | Admit: 2023-09-13 | Discharge: 2023-09-13 | Disposition: A | Discharge status: Home / Self Care | Source: Ambulatory Visit

## 2023-09-13 DIAGNOSIS — R600 Localized edema: Secondary | ICD-10-CM | POA: Diagnosis present

## 2023-09-16 ENCOUNTER — Ambulatory Visit: Admission: EM | Admit: 2023-09-16 | Discharge: 2023-09-16 | Disposition: A | Discharge status: Home / Self Care

## 2023-09-16 DIAGNOSIS — B029 Zoster without complications: Secondary | ICD-10-CM

## 2023-09-16 MED ORDER — VALACYCLOVIR HCL 1 G PO TABS: 1000.0000 mg | ORAL_TABLET | Freq: Three times a day (TID) | ORAL | 0 refills | Status: AC

## 2023-09-16 MED ORDER — LIDOCAINE 5 % EX OINT: 1.0000 | TOPICAL_OINTMENT | CUTANEOUS | 0 refills | Status: AC | PRN

## 2023-09-16 NOTE — ED Triage Notes (Signed)
 Pt c/o shingles  Pt states that she had shingles starting on 09/03/23 and they did not go away since then.

## 2023-09-16 NOTE — Discharge Instructions (Signed)
 Today you were evaluated for a rash which is consistent with presentation of shingles that you have had in the past  Take valacyclovir  every 8 hours for 7 days to reduce the amount of virus within the body which helps to calm symptoms  You may rub topical lidocaine cream over the affected area as needed to help with pain  You may continue use of Benadryl, may take orally or use topical medicine  May take Tylenol  as needed for pain  If your symptoms continue to persist please follow-up with your primary doctor for reevaluation

## 2023-09-16 NOTE — ED Provider Notes (Signed)
 [name]    CSN: [id number] Arrival date & time: 09/16/23  0815      History   Chief Complaint Chief Complaint  Patient presents with   Herpes Zoster    HPI Jaime Stevenson is a 88 y.o. female.   Patient presents for evaluation of a erythematous pruritic and painful rash present to the left side of the back wrapping to the flank 6 days ago.  Endorses this is her typical presentation of shingles, has had reoccurring episodes since the spring 2024.  Has attempted use of Benadryl.  Denies changes in medications, toiletries diet or recent travel.  Past Medical History:  Diagnosis Date   Heart disease    Hypertension     There are no active problems to display for this patient.   Past Surgical History:  Procedure Laterality Date   ABDOMINAL HYSTERECTOMY     CARDIAC SURGERY     stent placement    OB History     Gravida  3   Para  3   Term  3   Preterm      AB      Living  3      SAB      IAB      Ectopic      Multiple      Live Births  3            Home Medications    Prior to Admission medications   Medication Sig Start Date End Date Taking? Authorizing Provider  lidocaine (XYLOCAINE) 5 % ointment Apply 1 Application topically as needed. 09/16/23  Yes Hakeem Frazzini R, NP  metoprolol succinate (TOPROL-XL) 25 MG 24 hr tablet Take 1 tablet by mouth daily. 09/13/23 09/12/24 Yes [provider]  rosuvastatin (CRESTOR) 5 MG tablet Take 5 mg by mouth. 09/13/23 09/12/24 Yes [provider]  valACYclovir  (VALTREX ) 1000 MG tablet Take 1 tablet (1,000 mg total) by mouth 3 (three) times daily for 7 days. 09/16/23 09/23/23 Yes Cannon Quinton, Maybelle Spatz, NP  chlorthalidone (HYGROTON) 25 MG tablet  08/10/17  Yes [provider]  clobetasol  ointment (TEMOVATE ) 0.05 % Apply to affected area every night for 4 weeks, then every other day for 4 weeks and then twice a week for 4 weeks or until resolution. Patient not taking: Reported on 10/07/2017  09/16/17   Heron Lord, MD  docusate sodium  (COLACE) 50 MG capsule Take 1 capsule (50 mg total) by mouth daily. 09/13/22   Reena Canning, NP  famotidine (PEPCID) 20 MG tablet Take 20 mg by mouth 2 (two) times daily.    [provider]  lactulose  (CHRONULAC ) 10 GM/15ML solution Take 15 mLs (10 g total) by mouth daily as needed for mild constipation. 09/13/22   Florie Carico, Maybelle Spatz, NP  lactulose  (CHRONULAC ) 10 GM/15ML solution Take 15 mLs (10 g total) by mouth daily as needed for mild constipation. 09/13/22   Reena Canning, NP  metoprolol tartrate (LOPRESSOR) 25 MG tablet  08/25/17   [provider]  oxyCODONE -acetaminophen  (PERCOCET/ROXICET) 5-325 MG tablet Take 1 tablet by mouth every 6 (six) hours as needed for severe pain. 08/28/22   Cuthriell, Ardath Bears, PA-C  predniSONE  (STERAPRED UNI-PAK 21 TAB) 10 MG (21) TBPK tablet Take by mouth daily. Take 6 tabs by mouth daily  for 1 days, then 5 tabs for 1 days, then 4 tabs for 1 days, then 3 tabs for 1 days, 2 tabs for 1 days, then 1 tab  by mouth daily for 1 days 09/13/22   Reena Canning, NP  warfarin (COUMADIN) 2 MG tablet  09/06/17  Yes [provider]    Family History Family History  Problem Relation Age of Onset   Ovarian cancer Mother 74    Social History Social History   Tobacco Use   Smoking status: Never   Smokeless tobacco: Never  Vaping Use   Vaping status: Never Used  Substance Use Topics   Alcohol use: Never   Drug use: Never     Allergies   Morphine   Review of Systems Review of Systems   Physical Exam Triage Vital Signs ED Triage Vitals  Encounter Vitals Group     BP 09/16/23 0825 (!) 166/81     Systolic BP Percentile --      Diastolic BP Percentile --      Pulse Rate 09/16/23 0825 81     Resp --      Temp 09/16/23 0825 98.1 F (36.7 C)     Temp Source 09/16/23 0825 Oral     SpO2 09/16/23 0825 93 %     Weight 09/16/23 0827 181 lb (82.1 kg)     Height 09/16/23 0827 5\' 5"   (1.651 m)     Head Circumference --      Peak Flow --      Pain Score 09/16/23 0826 5     Pain Loc --      Pain Education --      Exclude from Growth Chart --    No data found.  Updated Vital Signs BP (!) 166/81 (BP Location: Left Arm)   Pulse 81   Temp 98.1 F (36.7 C) (Oral)   Ht 5\' 5"  (1.651 m)   Wt 181 lb (82.1 kg)   SpO2 93%   BMI 30.12 kg/m   Visual Acuity Right Eye Distance:   Left Eye Distance:   Bilateral Distance:    Right Eye Near:   Left Eye Near:    Bilateral Near:     Physical Exam Constitutional:      Appearance: Normal appearance.  Eyes:     Extraocular Movements: Extraocular movements intact.  Pulmonary:     Effort: Pulmonary effort is normal.  Musculoskeletal:     Comments: Erythematous maculopapular rash present to the left thoracic region wrapping to the left flank  Neurological:     Mental Status: She is alert and oriented to person, place, and time.      UC Treatments / Results  Labs (all labs ordered are listed, but only abnormal results are displayed) Labs Reviewed - No data to display  EKG   Radiology No results found.  Procedures Procedures (including critical care time)  Medications Ordered in UC Medications - No data to display  Initial Impression / Assessment and Plan / UC Course  I have reviewed the triage vital signs and the nursing notes.  Pertinent labs & imaging results that were available during my care of the patient were reviewed by me and considered in my medical decision making (see chart for details).  Herpes zoster without complication  Presentation consistent with shingles, has not began to blister at this time, discussed with patient, prescribed valacyclovir , endorses success with this medicine in the past, additionally has prescribed viscous lidocaine, declined prednisone  recommended additional supportive care and advised follow-up with PCP if symptoms continue to persist Final Clinical Impressions(s) /  UC Diagnoses   Final diagnoses:  Herpes zoster without complication  Discharge Instructions      Today you were evaluated for a rash which is consistent with presentation of shingles that you have had in the past  Take valacyclovir  every 8 hours for 7 days to reduce the amount of virus within the body which helps to calm symptoms  You may rub topical lidocaine cream over the affected area as needed to help with pain  You may continue use of Benadryl, may take orally or use topical medicine  May take Tylenol  as needed for pain  If your symptoms continue to persist please follow-up with your primary doctor for reevaluation  ED Prescriptions     Medication Sig Dispense Auth. Provider   valACYclovir  (VALTREX ) 1000 MG tablet Take 1 tablet (1,000 mg total) by mouth 3 (three) times daily for 7 days. 21 tablet Clarnce Homan R, NP   lidocaine (XYLOCAINE) 5 % ointment Apply 1 Application topically as needed. 35.44 g Reena Canning, NP      PDMP not reviewed this encounter.   Reena Canning, [place] 09/16/23 [phone]

## 2023-09-27 ENCOUNTER — Inpatient Hospital Stay: Attending: Oncology | Admitting: Oncology

## 2023-09-27 ENCOUNTER — Inpatient Hospital Stay

## 2023-09-27 ENCOUNTER — Encounter: Payer: Self-pay | Admitting: Oncology

## 2023-09-27 VITALS — BP 156/68 | HR 70 | Temp 98.4°F | Resp 16 | Ht 65.0 in | Wt 184.8 lb

## 2023-09-27 DIAGNOSIS — R1904 Left lower quadrant abdominal swelling, mass and lump: Secondary | ICD-10-CM

## 2023-09-27 DIAGNOSIS — R591 Generalized enlarged lymph nodes: Secondary | ICD-10-CM | POA: Insufficient documentation

## 2023-09-27 DIAGNOSIS — Z8041 Family history of malignant neoplasm of ovary: Secondary | ICD-10-CM | POA: Diagnosis not present

## 2023-09-27 LAB — COMPREHENSIVE METABOLIC PANEL WITH GFR
ALT: 19 U/L (ref 0–44)
AST: 24 U/L (ref 15–41)
Albumin: 3.7 g/dL (ref 3.5–5.0)
Alkaline Phosphatase: 53 U/L (ref 38–126)
Anion gap: 12 (ref 5–15)
BUN: 28 mg/dL — ABNORMAL HIGH (ref 8–23)
CO2: 26 mmol/L (ref 22–32)
Calcium: 9.2 mg/dL (ref 8.9–10.3)
Chloride: 101 mmol/L (ref 98–111)
Creatinine, Ser: 0.93 mg/dL (ref 0.44–1.00)
GFR, Estimated: >60 mL/min (ref >60)
Glucose, Bld: 121 mg/dL — ABNORMAL HIGH (ref 70–99)
Potassium: 3.7 mmol/L (ref 3.5–5.1)
Sodium: 139 mmol/L (ref 135–145)
Total Bilirubin: 0.7 mg/dL (ref 0.0–1.2)
Total Protein: 7.9 g/dL (ref 6.5–8.1)

## 2023-09-27 LAB — CBC WITH DIFFERENTIAL/PLATELET
Abs Immature Granulocytes: 0.04 10*3/uL (ref 0.00–0.07)
Basophils Absolute: 0.1 10*3/uL (ref 0.0–0.1)
Basophils Relative: 1 %
Eosinophils Absolute: 0.3 10*3/uL (ref 0.0–0.5)
Eosinophils Relative: 3 %
HCT: 37.8 % (ref 36.0–46.0)
Hemoglobin: 11.9 g/dL — ABNORMAL LOW (ref 12.0–15.0)
Immature Granulocytes: 0 %
Lymphocytes Relative: 27 %
Lymphs Abs: 2.5 10*3/uL (ref 0.7–4.0)
MCH: 28.2 pg (ref 26.0–34.0)
MCHC: 31.5 g/dL (ref 30.0–36.0)
MCV: 89.6 fL (ref 80.0–100.0)
Monocytes Absolute: 0.9 10*3/uL (ref 0.1–1.0)
Monocytes Relative: 10 %
Neutro Abs: 5.5 10*3/uL (ref 1.7–7.7)
Neutrophils Relative %: 59 %
Platelets: 190 10*3/uL (ref 150–400)
RBC: 4.22 MIL/uL (ref 3.87–5.11)
RDW: 15.2 % (ref 11.5–15.5)
WBC: 9.3 10*3/uL (ref 4.0–10.5)
nRBC: 0 % (ref 0.0–0.2)

## 2023-09-27 LAB — LACTATE DEHYDROGENASE: LDH: 169 U/L (ref 98–192)

## 2023-09-27 NOTE — Progress Notes (Signed)
 Has some swelling and pain in left leg and groin area.

## 2023-09-28 LAB — CA 125: Cancer Antigen (CA) 125: 10.5 U/mL (ref 0.0–38.1)

## 2023-09-28 NOTE — Progress Notes (Signed)
 [facility]  [phone] [phone]  ID: Jaime Stevenson OB: 02-18-1936  MR#: [id number]  QIO#:[id number]  Patient Care Team: Little Riff, MD as PCP - General (Internal Medicine) Shellie Dials, MD as Consulting Physician (Oncology)  CHIEF COMPLAINT: Left inguinal lymphadenopathy.  INTERVAL HISTORY: Patient is an 88 year old female who recently presented with left leg swelling.  Subsequent ultrasound did not reveal any DVT but noted left leg with lymphadenopathy.  She is referred for further evaluation.  She otherwise feels well.  She has no neurologic complaints.  She denies any recent fevers or illnesses.  She has a good appetite and denies weight loss.  She has no chest pain, shortness of breath, cough, or hemoptysis.  She denies any nausea, vomiting, constipation, or diarrhea.  She has no urinary complaints.  Patient offers no further specific complaints today.  REVIEW OF SYSTEMS:   Review of Systems  Constitutional: Negative.  Negative for fever, malaise/fatigue and weight loss.  Respiratory: Negative.  Negative for cough, hemoptysis and shortness of breath.   Cardiovascular:  Positive for leg swelling. Negative for chest pain.  Gastrointestinal: Negative.  Negative for abdominal pain.  Genitourinary: Negative.  Negative for dysuria.  Musculoskeletal: Negative.  Negative for back pain.  Skin: Negative.  Negative for rash.  Neurological: Negative.  Negative for dizziness, focal weakness, weakness and headaches.  Psychiatric/Behavioral: Negative.  The patient is not nervous/anxious.     As per HPI. Otherwise, a complete review of systems is negative.  PAST MEDICAL HISTORY: Past Medical History:  Diagnosis Date   GERD (gastroesophageal reflux disease)    Heart disease    Hypertension    Stroke (HCC)    Ulcer     PAST SURGICAL HISTORY: Past Surgical History:  Procedure Laterality Date   ABDOMINAL HYSTERECTOMY     APPENDECTOMY      CARDIAC SURGERY     stent placement   EYE SURGERY     TUBAL LIGATION      FAMILY HISTORY: Family History  Problem Relation Age of Onset   Ovarian cancer Mother 16   Cancer Mother    Stroke Mother    Heart disease Brother    Stroke Brother     ADVANCED DIRECTIVES (Y/N):  N  HEALTH MAINTENANCE: Social History   Tobacco Use   Smoking status: Never   Smokeless tobacco: Never  Vaping Use   Vaping status: Never Used  Substance Use Topics   Alcohol use: Never   Drug use: Never     Colonoscopy:  PAP:  Bone density:  Lipid panel:  Allergies  Allergen Reactions   Morphine     Other reaction(s): Hallucination    Current Outpatient Medications  Medication Sig Dispense Refill   chlorthalidone (HYGROTON) 25 MG tablet   3   lidocaine  (XYLOCAINE ) 5 % ointment Apply 1 Application topically as needed. 35.44 g 0   metoprolol succinate (TOPROL-XL) 25 MG 24 hr tablet Take 1 tablet by mouth daily.     potassium chloride (KLOR-CON) 10 MEQ tablet Take 1 tablet by mouth daily.     rosuvastatin (CRESTOR) 5 MG tablet Take 5 mg by mouth.     warfarin (COUMADIN) 2 MG tablet      clobetasol  ointment (TEMOVATE ) 0.05 % Apply to affected area every night for 4 weeks, then every other day for 4 weeks and then twice a week for 4 weeks or until resolution. (Patient not taking: Reported on 09/27/2023) 30 g 5   docusate  sodium (COLACE) 50 MG capsule Take 1 capsule (50 mg total) by mouth daily. (Patient not taking: Reported on 09/27/2023) 30 capsule 1   famotidine (PEPCID) 20 MG tablet Take 20 mg by mouth 2 (two) times daily. (Patient not taking: Reported on 09/27/2023)     lactulose  (CHRONULAC ) 10 GM/15ML solution Take 15 mLs (10 g total) by mouth daily as needed for mild constipation. (Patient not taking: Reported on 09/27/2023) 236 mL 0   lactulose  (CHRONULAC ) 10 GM/15ML solution Take 15 mLs (10 g total) by mouth daily as needed for mild constipation. (Patient not taking: Reported on 09/27/2023) 236  mL 0   metoprolol tartrate (LOPRESSOR) 25 MG tablet  (Patient not taking: Reported on 09/27/2023)     oxyCODONE -acetaminophen  (PERCOCET/ROXICET) 5-325 MG tablet Take 1 tablet by mouth every 6 (six) hours as needed for severe pain. (Patient not taking: Reported on 09/27/2023) 20 tablet 0   predniSONE  (STERAPRED UNI-PAK 21 TAB) 10 MG (21) TBPK tablet Take by mouth daily. Take 6 tabs by mouth daily  for 1 days, then 5 tabs for 1 days, then 4 tabs for 1 days, then 3 tabs for 1 days, 2 tabs for 1 days, then 1 tab by mouth daily for 1 days (Patient not taking: Reported on 09/27/2023) 21 tablet 0   No current facility-administered medications for this visit.    OBJECTIVE: Vitals:   09/27/23 1507  BP: (!) 156/68  Pulse: 70  Resp: 16  Temp: 98.4 F (36.9 C)  SpO2: 97%     Body mass index is 30.75 kg/m.    ECOG FS:0 - Asymptomatic  General: Well-developed, well-nourished, no acute distress. Eyes: Pink conjunctiva, anicteric sclera. HEENT: Normocephalic, moist mucous membranes. Lungs: No audible wheezing or coughing. Heart: Regular rate and rhythm. Abdomen: Soft, nontender, no obvious distention. Musculoskeletal: No edema, cyanosis, or clubbing. Neuro: Alert, answering all questions appropriately. Cranial nerves grossly intact. Skin: No rashes or petechiae noted. Psych: Normal affect. Lymphatics: No cervical, calvicular, axillary or inguinal LAD.   LAB RESULTS:  Lab Results  Component Value Date   NA 139 09/27/2023   K 3.7 09/27/2023   CL 101 09/27/2023   CO2 26 09/27/2023   GLUCOSE 121 (H) 09/27/2023   BUN 28 (H) 09/27/2023   CREATININE 0.93 09/27/2023   CALCIUM 9.2 09/27/2023   PROT 7.9 09/27/2023   ALBUMIN 3.7 09/27/2023   AST 24 09/27/2023   ALT 19 09/27/2023   ALKPHOS 53 09/27/2023   BILITOT 0.7 09/27/2023   GFRNONAA >60 09/27/2023    Lab Results  Component Value Date   WBC 9.3 09/27/2023   NEUTROABS 5.5 09/27/2023   HGB 11.9 (L) 09/27/2023   HCT 37.8 09/27/2023    MCV 89.6 09/27/2023   PLT 190 09/27/2023     STUDIES: US  Venous Img Lower Unilateral Left (DVT) Result Date: 09/13/2023 CLINICAL DATA:  Lower extremity edema EXAM: LOWER EXTREMITY VENOUS DOPPLER ULTRASOUND TECHNIQUE: Gray-scale sonography with compression, as well as color and duplex ultrasound, were performed to evaluate the deep venous system(s) from the level of the common femoral vein through the popliteal and proximal calf veins. COMPARISON:  Left lower extremity venous Doppler ultrasound 05/04/2021. FINDINGS: VENOUS Normal compressibility of the common femoral, superficial femoral, and popliteal veins, as well as the visualized calf veins. Visualized portions of profunda femoral vein and great saphenous vein unremarkable. No filling defects to suggest DVT on grayscale or color Doppler imaging. Doppler waveforms show normal direction of venous flow, normal respiratory plasticity and response to  augmentation. Limited views of the contralateral common femoral vein are unremarkable. OTHER There is a prominent lymph node in the left side measuring 4.0 x 1.0 x 2.2 cm. Limitations: none IMPRESSION: 1. No evidence of left lower extremity DVT. 2. Prominent lymph node in the left side measuring 4.0 x 1.0 x 2.2 cm. Electronically Signed   By: Tyron Gallon M.D.   On: 09/13/2023 15:42    ASSESSMENT: Left inguinal lymphadenopathy.  PLAN:    Left inguinal lymphadenopathy: Ultrasound results from September 13, 2023 reviewed independently and report as above with a prominent left-sided inguinal lymph node measuring 4.0 x 1.0 x 2.2 cm.  No evidence of DVT was reported.  Peripheral blood flow cytometry was ordered for completeness and is pending at time of dictation.  Will get a CT scan of the chest, abdomen, and pelvis for further evaluation.  Patient with her in the clinic 1 week after imaging to discuss the results and consideration of biopsy if necessary.  I spent a total of 45 minutes reviewing chart data,  face-to-face evaluation with the patient, counseling and coordination of care as detailed above.   Patient expressed understanding and was in agreement with this plan. She also understands that She can call clinic at any time with any questions, concerns, or complaints.    Cancer Staging  No matching staging information was found for the patient.   Shellie Dials, MD   09/28/2023 8:59 AM

## 2023-09-30 LAB — COMP PANEL: LEUKEMIA/LYMPHOMA

## 2023-10-11 ENCOUNTER — Ambulatory Visit
Admission: RE | Admit: 2023-10-11 | Discharge: 2023-10-11 | Disposition: A | Discharge status: Home / Self Care | Source: Ambulatory Visit | Attending: Oncology | Admitting: Oncology

## 2023-10-11 DIAGNOSIS — R591 Generalized enlarged lymph nodes: Secondary | ICD-10-CM | POA: Diagnosis present

## 2023-10-11 MED ORDER — IOHEXOL 300 MG/ML  SOLN
100.0000 mL | Freq: Once | INTRAMUSCULAR | Status: AC | PRN
  Administered 2023-10-11: 100 mL via INTRAVENOUS

## 2023-10-18 ENCOUNTER — Encounter: Payer: Self-pay | Admitting: Oncology

## 2023-10-18 ENCOUNTER — Inpatient Hospital Stay: Attending: Oncology | Admitting: Oncology

## 2023-10-18 VITALS — BP 191/70 | HR 70 | Temp 98.5°F | Resp 16 | Wt 184.0 lb

## 2023-10-18 DIAGNOSIS — R590 Localized enlarged lymph nodes: Secondary | ICD-10-CM | POA: Insufficient documentation

## 2023-10-18 DIAGNOSIS — R591 Generalized enlarged lymph nodes: Secondary | ICD-10-CM

## 2023-10-18 DIAGNOSIS — Z8041 Family history of malignant neoplasm of ovary: Secondary | ICD-10-CM | POA: Insufficient documentation

## 2023-10-18 NOTE — Progress Notes (Signed)
 [facility]  [phone] [phone]  ID: Jaime Stevenson OB: 12/14/1935  MR#: [id number]  RDW#:[id number]  Patient Care Team: Rudolpho Norleen BIRCH, MD as PCP - General (Internal Medicine) Jacobo Evalene PARAS, MD as Consulting Physician (Oncology)  CHIEF COMPLAINT: Left inguinal lymphadenopathy.  INTERVAL HISTORY: Patient returns to clinic today for further evaluation and discussion of her imaging and laboratory results.  She currently feels well and is asymptomatic. She has no neurologic complaints.  She denies any recent fevers or illnesses.  She has a good appetite and denies weight loss.  She has no chest pain, shortness of breath, cough, or hemoptysis.  She denies any nausea, vomiting, constipation, or diarrhea.  She has no urinary complaints.  Patient offers no specific complaints today.  REVIEW OF SYSTEMS:   Review of Systems  Constitutional: Negative.  Negative for fever, malaise/fatigue and weight loss.  Respiratory: Negative.  Negative for cough, hemoptysis and shortness of breath.   Cardiovascular: Negative.  Negative for chest pain and leg swelling.  Gastrointestinal: Negative.  Negative for abdominal pain.  Genitourinary: Negative.  Negative for dysuria.  Musculoskeletal: Negative.  Negative for back pain.  Skin: Negative.  Negative for rash.  Neurological: Negative.  Negative for dizziness, focal weakness, weakness and headaches.  Psychiatric/Behavioral: Negative.  The patient is not nervous/anxious.     As per HPI. Otherwise, a complete review of systems is negative.  PAST MEDICAL HISTORY: Past Medical History:  Diagnosis Date   GERD (gastroesophageal reflux disease)    Heart disease    Hypertension    Stroke (HCC)    Ulcer     PAST SURGICAL HISTORY: Past Surgical History:  Procedure Laterality Date   ABDOMINAL HYSTERECTOMY     APPENDECTOMY     CARDIAC SURGERY     stent placement   EYE SURGERY     TUBAL LIGATION       FAMILY HISTORY: Family History  Problem Relation Age of Onset   Ovarian cancer Mother 33   Cancer Mother    Stroke Mother    Heart disease Brother    Stroke Brother     ADVANCED DIRECTIVES (Y/N):  N  HEALTH MAINTENANCE: Social History   Tobacco Use   Smoking status: Never   Smokeless tobacco: Never  Vaping Use   Vaping status: Never Used  Substance Use Topics   Alcohol use: Never   Drug use: Never     Colonoscopy:  PAP:  Bone density:  Lipid panel:  Allergies  Allergen Reactions   Morphine     Other reaction(s): Hallucination    Current Outpatient Medications  Medication Sig Dispense Refill   chlorthalidone (HYGROTON) 25 MG tablet   3   lidocaine  (XYLOCAINE ) 5 % ointment Apply 1 Application topically as needed. 35.44 g 0   metoprolol succinate (TOPROL-XL) 25 MG 24 hr tablet Take 1 tablet by mouth daily.     potassium chloride (KLOR-CON) 10 MEQ tablet Take 1 tablet by mouth daily.     rosuvastatin (CRESTOR) 5 MG tablet Take 5 mg by mouth.     warfarin (COUMADIN) 2 MG tablet      clobetasol  ointment (TEMOVATE ) 0.05 % Apply to affected area every night for 4 weeks, then every other day for 4 weeks and then twice a week for 4 weeks or until resolution. (Patient not taking: Reported on 10/18/2023) 30 g 5   docusate sodium  (COLACE) 50 MG capsule Take 1 capsule (50 mg total) by mouth daily. (Patient not  taking: Reported on 10/18/2023) 30 capsule 1   famotidine (PEPCID) 20 MG tablet Take 20 mg by mouth 2 (two) times daily. (Patient not taking: Reported on 10/18/2023)     lactulose  (CHRONULAC ) 10 GM/15ML solution Take 15 mLs (10 g total) by mouth daily as needed for mild constipation. (Patient not taking: Reported on 10/18/2023) 236 mL 0   lactulose  (CHRONULAC ) 10 GM/15ML solution Take 15 mLs (10 g total) by mouth daily as needed for mild constipation. (Patient not taking: Reported on 10/18/2023) 236 mL 0   metoprolol tartrate (LOPRESSOR) 25 MG tablet  (Patient not taking:  Reported on 10/18/2023)     oxyCODONE -acetaminophen  (PERCOCET/ROXICET) 5-325 MG tablet Take 1 tablet by mouth every 6 (six) hours as needed for severe pain. (Patient not taking: Reported on 10/18/2023) 20 tablet 0   predniSONE  (STERAPRED UNI-PAK 21 TAB) 10 MG (21) TBPK tablet Take by mouth daily. Take 6 tabs by mouth daily  for 1 days, then 5 tabs for 1 days, then 4 tabs for 1 days, then 3 tabs for 1 days, 2 tabs for 1 days, then 1 tab by mouth daily for 1 days (Patient not taking: Reported on 10/18/2023) 21 tablet 0   No current facility-administered medications for this visit.    OBJECTIVE: Vitals:   10/18/23 0927 10/18/23 0930  BP: (!) 161/67 (!) 191/70  Pulse: 70   Resp: 16   Temp: 98.5 F (36.9 C)   SpO2: 99%      Body mass index is 30.62 kg/m.    ECOG FS:0 - Asymptomatic  General: Well-developed, well-nourished, no acute distress. Eyes: Pink conjunctiva, anicteric sclera. HEENT: Normocephalic, moist mucous membranes. Lungs: No audible wheezing or coughing. Heart: Regular rate and rhythm. Abdomen: Soft, nontender, no obvious distention. Musculoskeletal: No edema, cyanosis, or clubbing. Neuro: Alert, answering all questions appropriately. Cranial nerves grossly intact. Skin: No rashes or petechiae noted. Psych: Normal affect.  LAB RESULTS:  Lab Results  Component Value Date   NA 139 09/27/2023   K 3.7 09/27/2023   CL 101 09/27/2023   CO2 26 09/27/2023   GLUCOSE 121 (H) 09/27/2023   BUN 28 (H) 09/27/2023   CREATININE 0.93 09/27/2023   CALCIUM 9.2 09/27/2023   PROT 7.9 09/27/2023   ALBUMIN 3.7 09/27/2023   AST 24 09/27/2023   ALT 19 09/27/2023   ALKPHOS 53 09/27/2023   BILITOT 0.7 09/27/2023   GFRNONAA >60 09/27/2023    Lab Results  Component Value Date   WBC 9.3 09/27/2023   NEUTROABS 5.5 09/27/2023   HGB 11.9 (L) 09/27/2023   HCT 37.8 09/27/2023   MCV 89.6 09/27/2023   PLT 190 09/27/2023     STUDIES: CT CHEST ABDOMEN PELVIS W CONTRAST Result Date:  10/12/2023 CLINICAL DATA:  Left inguinal adenopathy on ultrasound performed for lower extremity edema. EXAM: CT CHEST, ABDOMEN, AND PELVIS WITH CONTRAST TECHNIQUE: Multidetector CT imaging of the chest, abdomen and pelvis was performed following the standard protocol during bolus administration of intravenous contrast. RADIATION DOSE REDUCTION: This exam was performed according to the departmental dose-optimization program which includes automated exposure control, adjustment of the mA and/or kV according to patient size and/or use of iterative reconstruction technique. CONTRAST:  OMNIPAQUE  IOHEXOL  300 MG/ML  SOLN COMPARISON:  09/13/2023 lower extremity ultrasound. 07/11/2020 abdominopelvic CT. No prior chest CT. FINDINGS: CT CHEST FINDINGS Cardiovascular: Aortic atherosclerosis. Tortuous thoracic aorta. Mild cardiomegaly, without pericardial effusion. Lad and right coronary artery calcification. No central pulmonary embolism, on this non-dedicated study. Mediastinum/Nodes: No supraclavicular  adenopathy. No axillary adenopathy. No mediastinal or hilar adenopathy. Lungs/Pleura: No pleural fluid. Minimal motion degradation inferiorly. Right base scarring. Musculoskeletal: Included within the abdomen pelvic section. CT ABDOMEN PELVIS FINDINGS Hepatobiliary: Suspect mild hepatic steatosis. Subtle irregular hepatic capsule including on 57/2. Gallstones up to 2 cm without acute cholecystitis or biliary duct dilatation. Pancreas: Normal for age. Atrophy but no duct dilatation or acute inflammation. Spleen: Normal in size, without focal abnormality. Adrenals/Urinary Tract: Normal adrenal glands. Normal kidneys, without hydronephrosis. Normal urinary bladder. Stomach/Bowel: Normal stomach, without wall thickening. Transverse duodenal diverticulum. Otherwise normal small bowel. Extensive colonic diverticulosis.  Normal terminal ileum. Vascular/Lymphatic: Advanced aortic and branch vessel atherosclerosis. No  abdominopelvic adenopathy. No left inguinal adenopathy. The largest node maintains its fatty hilum at 1.0 cm on 125/2 and is similar to the prior. Reproductive: Hysterectomy. Peripherally calcified low-density structure of 1.7 x 1.2 cm along the left pelvic sidewall on 103/2 is similar to the 07/11/2020 exam and may represent ovarian parenchyma or lymphangioma. Of no acute significance given stability. Other: No significant free fluid.  No free intraperitoneal air. Musculoskeletal: Lumbosacral spondylosis, most significant degenerative disc disease at L4-5. IMPRESSION: 1. No evidence of left inguinal adenopathy or mass. No correlate for the ultrasound abnormality. 2. No acute findings in the chest, abdomen, or pelvis. 3. Suspect mild hepatic steatosis and possible mild cirrhosis. Correlate with risk factors. 4. Cholelithiasis 5. Incidental findings, including: Coronary artery atherosclerosis. Aortic Atherosclerosis (ICD10-I70.0). Electronically Signed   By: Rockey Kilts M.D.   On: 10/12/2023 14:45    ASSESSMENT: Left inguinal lymphadenopathy.  PLAN:    Left inguinal lymphadenopathy: Resolved.  CT scan results from October 12, 2023 reviewed independently and reported as above with no inguinal lymphadenopathy or other significant pathology noted.  Laboratory work including peripheral blood flow cytometry was either negative or within normal limits.  No further intervention is needed.  No further follow-up has been scheduled.    I spent a total of 20 minutes reviewing chart data, face-to-face evaluation with the patient, counseling and coordination of care as detailed above.   Patient expressed understanding and was in agreement with this plan. She also understands that She can call clinic at any time with any questions, concerns, or complaints.    Evalene JINNY Reusing, MD   10/18/2023 10:15 AM

## 2023-10-26 IMAGING — US US EXTREM LOW VENOUS*L*
1 series · 13 of 24 positions shown · non-contrast
Comparison: None.

CLINICAL DATA: 85-year-old female with left lower extremity edema.

EXAM:
LEFT LOWER EXTREMITY VENOUS DOPPLER ULTRASOUND
TECHNIQUE: Gray-scale sonography with graded compression, as well as color
Doppler and duplex ultrasound were performed to evaluate the left
lower extremity deep venous systems from the level of the common
femoral vein and including the common femoral, femoral, profunda
femoral, popliteal and calf veins including the posterior tibial,
peroneal and gastrocnemius veins when visible. Spectral Doppler was
utilized to evaluate flow at rest and with distal augmentation
maneuvers in the common femoral, femoral and popliteal veins. The
contralateral common femoral vein was also evaluated for comparison.

[Series 1: us venous img lower uni left (dvt) · portal-venous · 13 of 35 slices shown]
[im 1/35]
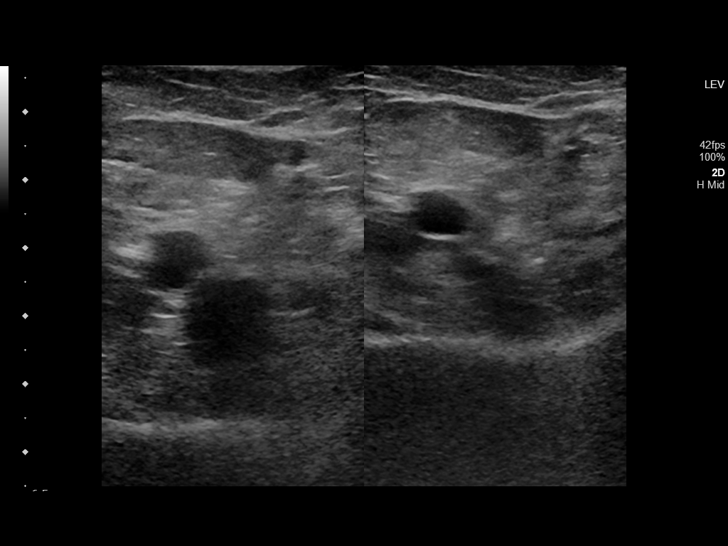
[im 3/35]
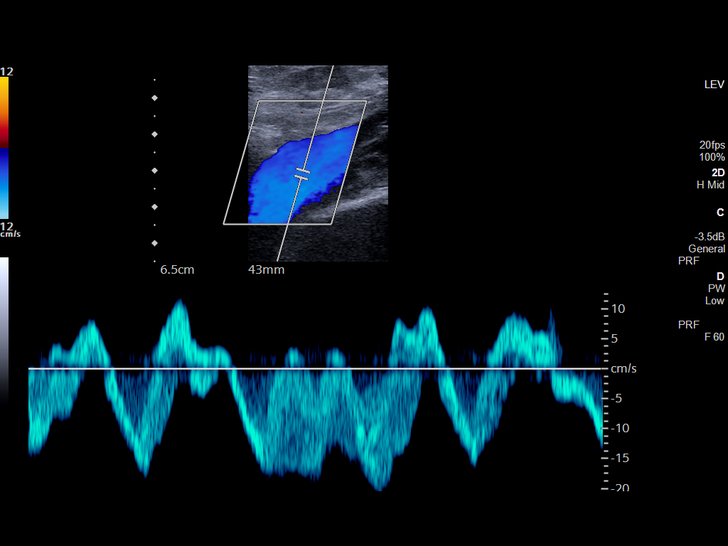
[im 6/35]
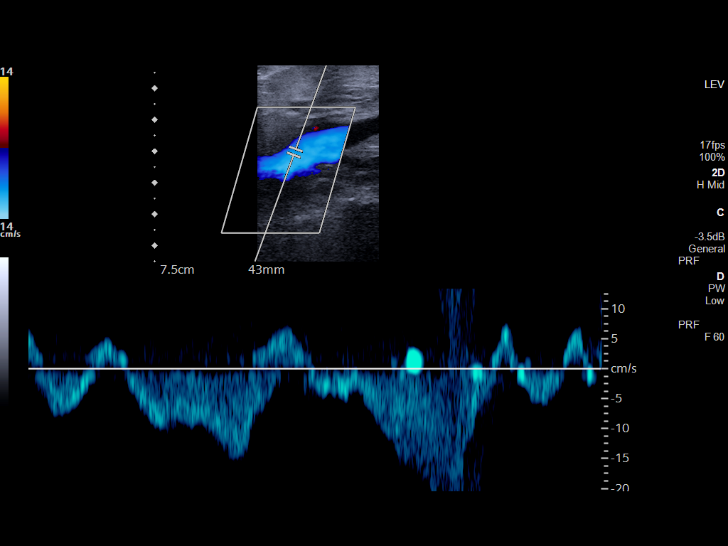
[im 9/35]
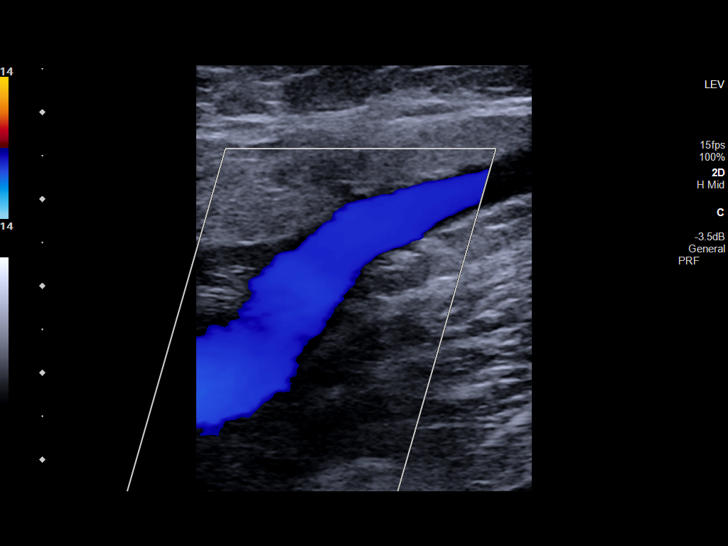
[im 12/35]
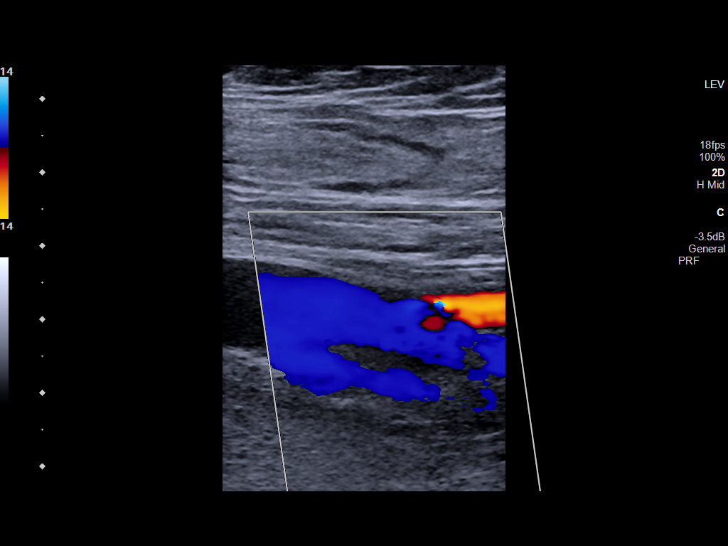
[im 15/35]
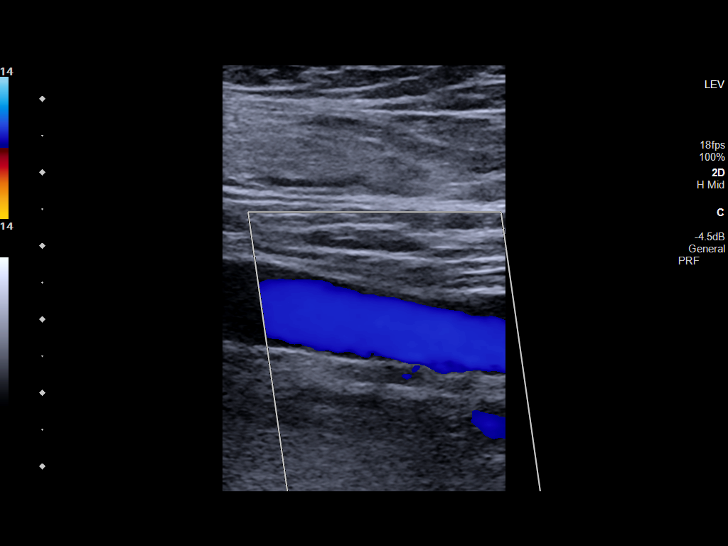
[im 18/35]
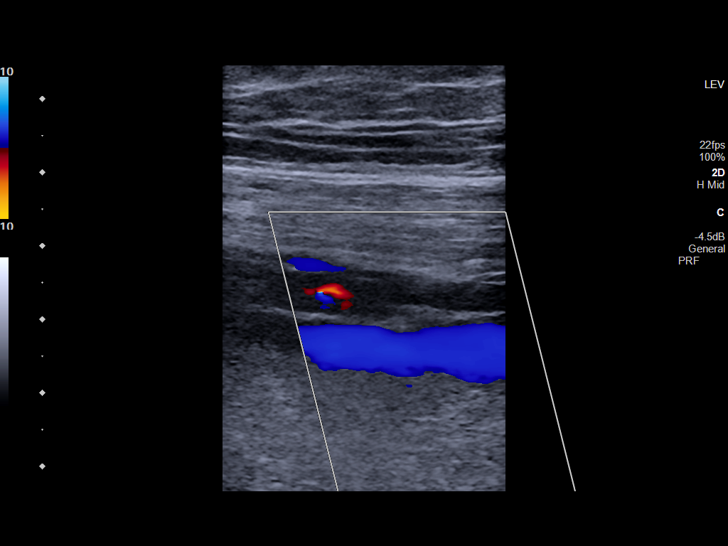
[im 20/35]
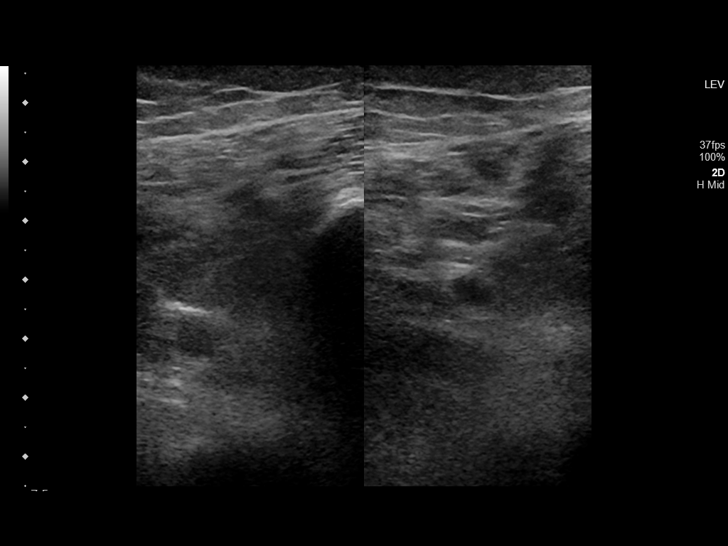
[im 23/35]
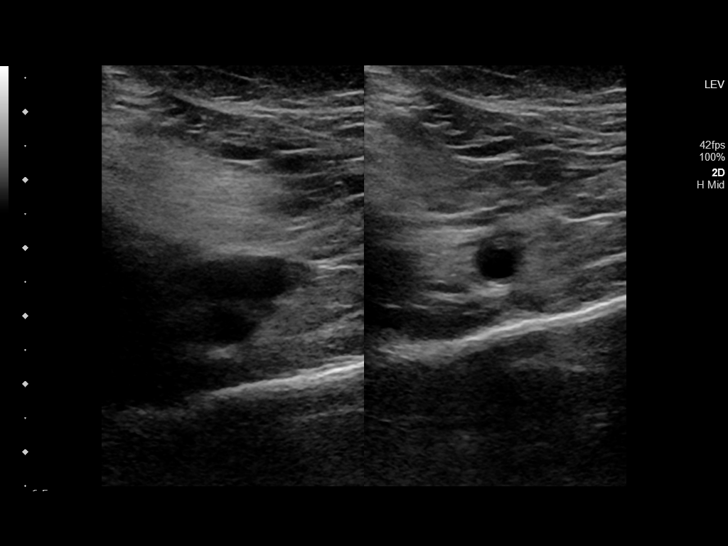
[im 26/35]
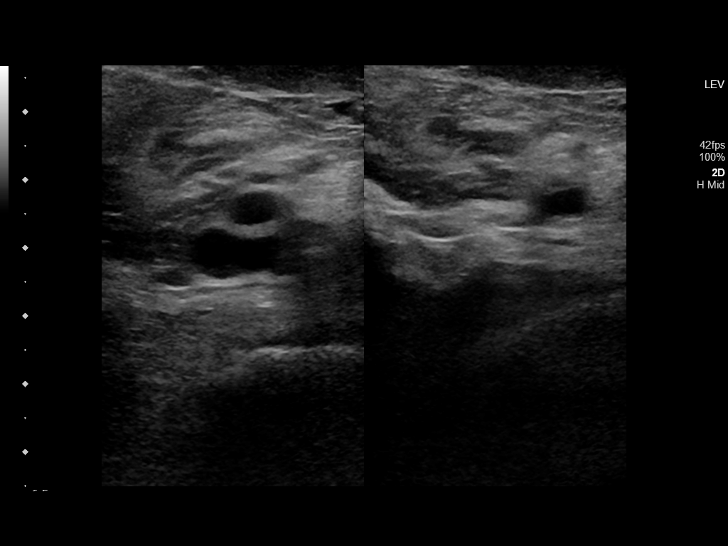
[im 29/35]
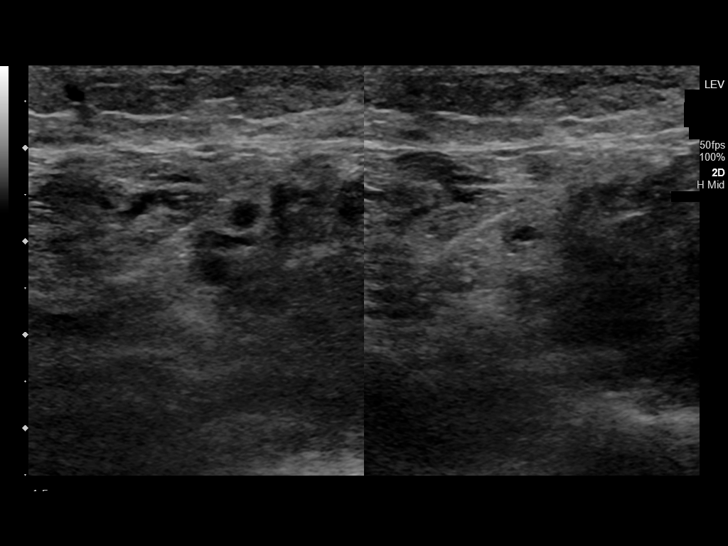
[im 32/35]
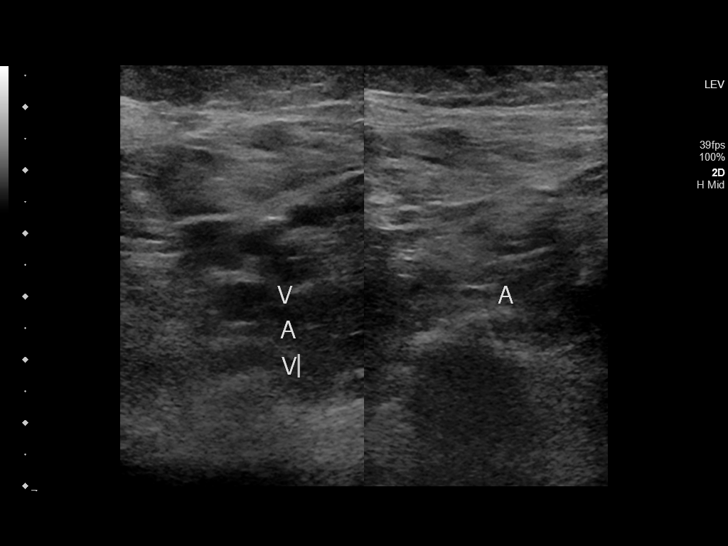
[im 35/35]
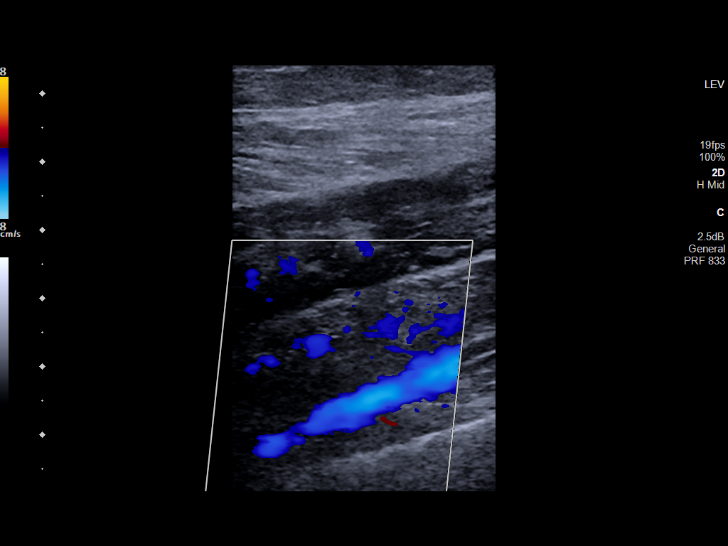

[13 of 24 positions shown; findings below may reference images not displayed]

FINDINGS: LEFT LOWER EXTREMITY

Common Femoral Vein: No evidence of thrombus. Normal
compressibility, respiratory phasicity and response to augmentation.

Central Greater Saphenous Vein: No evidence of thrombus. Normal
compressibility and flow on color Doppler imaging.

Central Profunda Femoral Vein: No evidence of thrombus. Normal
compressibility and flow on color Doppler imaging.

Femoral Vein: No evidence of thrombus. Normal compressibility,
respiratory phasicity and response to augmentation.

Popliteal Vein: No evidence of thrombus. Normal compressibility,
respiratory phasicity and response to augmentation.

Calf Veins: No evidence of thrombus. Normal compressibility and flow
on color Doppler imaging.

Other Findings:  None.

RIGHT LOWER EXTREMITY

Common Femoral Vein: No evidence of thrombus. Normal
compressibility, respiratory phasicity and response to augmentation.
IMPRESSION: No evidence of left lower extremity deep venous thrombosis.
